# Patient Record
Sex: Male | Born: 1945 | Race: Black or African American | Hispanic: No | Marital: Married | Smoking: Never smoker
Health system: Southern US, Community
[De-identification: ages and names within clinical notes are randomized; demographics above are authoritative.]

## PROBLEM LIST (undated history)

## (undated) DIAGNOSIS — Z941 Heart transplant status: Secondary | ICD-10-CM

## (undated) DIAGNOSIS — Z8619 Personal history of other infectious and parasitic diseases: Secondary | ICD-10-CM

## (undated) DIAGNOSIS — R972 Elevated prostate specific antigen [PSA]: Secondary | ICD-10-CM

## (undated) DIAGNOSIS — Z9989 Dependence on other enabling machines and devices: Secondary | ICD-10-CM

## (undated) DIAGNOSIS — I10 Essential (primary) hypertension: Secondary | ICD-10-CM

## (undated) DIAGNOSIS — K219 Gastro-esophageal reflux disease without esophagitis: Secondary | ICD-10-CM

## (undated) DIAGNOSIS — F419 Anxiety disorder, unspecified: Secondary | ICD-10-CM

## (undated) DIAGNOSIS — I509 Heart failure, unspecified: Secondary | ICD-10-CM

## (undated) DIAGNOSIS — J189 Pneumonia, unspecified organism: Secondary | ICD-10-CM

## (undated) DIAGNOSIS — G709 Myoneural disorder, unspecified: Secondary | ICD-10-CM

## (undated) DIAGNOSIS — T7840XA Allergy, unspecified, initial encounter: Secondary | ICD-10-CM

## (undated) DIAGNOSIS — M810 Age-related osteoporosis without current pathological fracture: Secondary | ICD-10-CM

## (undated) DIAGNOSIS — G4733 Obstructive sleep apnea (adult) (pediatric): Secondary | ICD-10-CM

## (undated) DIAGNOSIS — I420 Dilated cardiomyopathy: Secondary | ICD-10-CM

## (undated) DIAGNOSIS — M109 Gout, unspecified: Secondary | ICD-10-CM

## (undated) HISTORY — DX: Dependence on other enabling machines and devices: Z99.89

## (undated) HISTORY — DX: Anxiety disorder, unspecified: F41.9

## (undated) HISTORY — DX: Obstructive sleep apnea (adult) (pediatric): G47.33

## (undated) HISTORY — DX: Heart transplant status: Z94.1

## (undated) HISTORY — DX: Dilated cardiomyopathy: I42.0

## (undated) HISTORY — DX: Essential (primary) hypertension: I10

## (undated) HISTORY — DX: Elevated prostate specific antigen (PSA): R97.20

## (undated) HISTORY — DX: Gout, unspecified: M10.9

## (undated) HISTORY — DX: Age-related osteoporosis without current pathological fracture: M81.0

## (undated) HISTORY — DX: Pneumonia, unspecified organism: J18.9

## (undated) HISTORY — DX: Heart failure, unspecified: I50.9

## (undated) HISTORY — DX: Allergy, unspecified, initial encounter: T78.40XA

## (undated) HISTORY — DX: Myoneural disorder, unspecified: G70.9

## (undated) HISTORY — DX: Personal history of other infectious and parasitic diseases: Z86.19

## (undated) HISTORY — DX: Gastro-esophageal reflux disease without esophagitis: K21.9

---

## 2002-02-16 ENCOUNTER — Observation Stay (HOSPITAL_COMMUNITY): Admission: EM | Admit: 2002-02-16 | Discharge: 2002-02-17 | Payer: Self-pay | Admitting: *Deleted

## 2002-09-27 ENCOUNTER — Inpatient Hospital Stay (HOSPITAL_COMMUNITY): Admission: EM | Admit: 2002-09-27 | Discharge: 2002-10-05 | Payer: Self-pay | Admitting: Emergency Medicine

## 2002-09-27 ENCOUNTER — Encounter: Payer: Self-pay | Admitting: Emergency Medicine

## 2002-10-10 ENCOUNTER — Emergency Department (HOSPITAL_COMMUNITY): Admission: EM | Admit: 2002-10-10 | Discharge: 2002-10-10 | Payer: Self-pay | Admitting: Emergency Medicine

## 2008-11-29 DIAGNOSIS — Z941 Heart transplant status: Secondary | ICD-10-CM

## 2008-11-29 HISTORY — PX: HEART TRANSPLANT: SHX268

## 2008-11-29 HISTORY — DX: Heart transplant status: Z94.1

## 2010-12-07 DIAGNOSIS — Z8619 Personal history of other infectious and parasitic diseases: Secondary | ICD-10-CM

## 2010-12-07 HISTORY — DX: Personal history of other infectious and parasitic diseases: Z86.19

## 2013-10-19 ENCOUNTER — Encounter: Payer: Self-pay | Admitting: Family Medicine

## 2013-10-19 ENCOUNTER — Ambulatory Visit (INDEPENDENT_AMBULATORY_CARE_PROVIDER_SITE_OTHER): Payer: Medicare Other | Admitting: Family Medicine

## 2013-10-19 VITALS — BP 126/86 | HR 100 | Temp 98.2°F | Ht 69.0 in | Wt 185.8 lb

## 2013-10-19 DIAGNOSIS — I509 Heart failure, unspecified: Secondary | ICD-10-CM

## 2013-10-19 DIAGNOSIS — G250 Essential tremor: Secondary | ICD-10-CM

## 2013-10-19 DIAGNOSIS — I10 Essential (primary) hypertension: Secondary | ICD-10-CM | POA: Insufficient documentation

## 2013-10-19 DIAGNOSIS — M109 Gout, unspecified: Secondary | ICD-10-CM

## 2013-10-19 DIAGNOSIS — Z941 Heart transplant status: Secondary | ICD-10-CM

## 2013-10-19 MED ORDER — GABAPENTIN 100 MG PO CAPS: 100.0000 mg | ORAL_CAPSULE | Freq: Two times a day (BID) | ORAL | Status: DC

## 2013-10-19 NOTE — Assessment & Plan Note (Signed)
Will check urate next blood draw.

## 2013-10-19 NOTE — Assessment & Plan Note (Signed)
Strong fmhx. Discussed treatment - will do trial of gabapentin - discussing possible side effects of dizziness, sedation.

## 2013-10-19 NOTE — Patient Instructions (Addendum)
Start gabapentin 100mg  nightly for 4 days then twice daily to see if we can improve tremor. I will await records from prior urologist and from heart doctors at [facility]. Good to meet you today, call us with questions. Return in 3-4 months fasting for blood work and afterwards for [employer] visit.

## 2013-10-19 NOTE — Assessment & Plan Note (Signed)
Will await records from [facility] On cellcept and prograf as well as fosamax preventatively. Discussed pneumovax.

## 2013-10-19 NOTE — Progress Notes (Signed)
Pre-visit discussion using our clinic review tool. No additional management support is needed unless otherwise documented below in the visit note.  

## 2013-10-19 NOTE — Assessment & Plan Note (Signed)
Chronic, stable.  Continue diltiazem 120mg  bid regimen.

## 2013-10-19 NOTE — Progress Notes (Signed)
Subjective:    Patient ID: Frederick Stephens, male    DOB: 1946/01/05, 67 y.o.   MRN: [id number]  HPI CC: new pt to establish  Received medicare 8-9 years ago.  S/p heart transplant for CHF with cardiomyopathy - cardiologist at [facility] (Dr. Cindra Eves).  Sees them 4x/year regularly.  On prograf and cellcept - taking fosamax once weekly preventatively.  Has stress test regularly.  L muscle pulled recently while playing golf - rec tylenol, stretching, and ice.  Ongoing for last 2 months.  GERD - on omeprazole 20mg  once daily. HTN - on diltiazem 120mg  bid. Gout - did have flare 1 month ago, improved with diet change.  Not on any preventative med for this.  Tremors - noted over last several years.  Only of hands.  Intention tremor.  Worse with writing.  Has been on some med for this several years ago.  Does have family history of hand tremors.  No other neurological sxs like imbalance, dizziness, unsteadiness.  No memory trouble endorsed.  No urinary changes.  Normal sense of smell.  Preventative: Unsure last CPE Colonoscopy - normal done 6 years ago ~2008 (done in [place]) Prostate - sees urologist at [facility] yearly Flu shot - done Pneumovax - not done zostavax - not candidate  Medications and allergies reviewed and updated in chart.  Past histories reviewed and updated if relevant as below. There are no active problems to display for this patient.  Past Medical History  Diagnosis Date  . HTN (hypertension)   . Gout   . CHF (congestive heart failure)     ?HTN related  . H/O heart transplant 11/29/08    due to heart failure,cardiomyopathy  . Dilated cardiomyopathy   . History of chicken pox   . History of shingles 2012   Past Surgical History  Procedure Laterality Date  . Heart transplant  11/29/08    [facility]   History  Substance Use Topics  . Smoking status: Never Smoker   . Smokeless tobacco: Never Used  . Alcohol Use: No   Family History  Problem  Relation Age of Onset  . Gout Mother   . Gout Brother   . Hypertension Brother   . Hypertension Mother   . Heart disease Mother   . Heart disease Father   . Diabetes Brother   . Cancer Neg Hx   . Stroke Neg Hx    Allergies  Allergen Reactions  . Amiodarone Other (See Comments)    Gynocomastia   No current outpatient prescriptions on file prior to visit.   No current facility-administered medications on file prior to visit.     Review of Systems  Constitutional: Negative for fever, chills, activity change, appetite change, fatigue and unexpected weight change.  HENT: Negative for hearing loss.   Eyes: Negative for visual disturbance.  Respiratory: Negative for cough, chest tightness, shortness of breath and wheezing.   Cardiovascular: Negative for chest pain, palpitations and leg swelling.  Gastrointestinal: Negative for nausea, vomiting, abdominal pain, diarrhea, constipation, blood in stool and abdominal distention.  Genitourinary: Negative for hematuria and difficulty urinating.  Musculoskeletal: Negative for arthralgias, myalgias and neck pain.  Skin: Negative for rash.  Neurological: Negative for dizziness, seizures, syncope and headaches.  Hematological: Negative for adenopathy. Does not bruise/bleed easily.  Psychiatric/Behavioral: Negative for dysphoric mood. The patient is not nervous/anxious.        Objective:   Physical Exam  Nursing note and vitals reviewed. Constitutional: He is oriented to person, place,  and time. He appears well-developed and well-nourished. No distress.  HENT:  Head: Normocephalic and atraumatic.  Mouth/Throat: Oropharynx is clear and moist. No oropharyngeal exudate.  Eyes: Conjunctivae and EOM are normal. Pupils are equal, round, and reactive to light. No scleral icterus.  Neck: Normal range of motion. Neck supple. No thyromegaly present.  Cardiovascular: Normal rate, regular rhythm, normal heart sounds and intact distal pulses.   No  murmur heard. Pulses:      Radial pulses are 2+ on the right side, and 2+ on the left side.  Pulmonary/Chest: Effort normal and breath sounds normal. No respiratory distress. He has no wheezes. He has no rales.  Musculoskeletal: Normal range of motion. He exhibits no edema.  Lymphadenopathy:    He has no cervical adenopathy.  Neurological: He is alert and oriented to person, place, and time.  CN grossly intact, station and gait intact Resting tremor present bilaterally  Skin: Skin is warm and dry. No rash noted.  Psychiatric: He has a normal mood and affect. His behavior is normal. Judgment and thought content normal.       Assessment & Plan:

## 2013-11-28 LAB — CBC
HGB: 14.3 g/dL
WBC: 7
platelet count: 194

## 2013-11-28 LAB — COMPLETE METABOLIC PANEL WITH GFR
ALBUMIN: 4.4
ALK PHOS: 62 U/L
ALT: 11 U/L (ref 10–40)
AST: 12 U/L
Creat: 1.23
GLUCOSE: 97
Potassium: 4.5 mmol/L
Sodium: 141 mmol/L (ref 137–147)
Total Bilirubin: 0.5 mg/dL

## 2013-11-28 LAB — MAGNESIUM: Magnesium: 2.3 mg/dL (ref 1.6–2.4)

## 2013-12-07 DIAGNOSIS — M810 Age-related osteoporosis without current pathological fracture: Secondary | ICD-10-CM

## 2013-12-07 HISTORY — DX: Age-related osteoporosis without current pathological fracture: M81.0

## 2013-12-28 ENCOUNTER — Ambulatory Visit: Payer: Self-pay | Admitting: Family Medicine

## 2014-01-03 ENCOUNTER — Ambulatory Visit (INDEPENDENT_AMBULATORY_CARE_PROVIDER_SITE_OTHER)
Admission: RE | Admit: 2014-01-03 | Discharge: 2014-01-03 | Disposition: A | Discharge status: Home / Self Care | Payer: Medicare Other | Source: Ambulatory Visit | Attending: Family Medicine | Admitting: Family Medicine

## 2014-01-03 ENCOUNTER — Ambulatory Visit (INDEPENDENT_AMBULATORY_CARE_PROVIDER_SITE_OTHER): Payer: Medicare Other | Admitting: Family Medicine

## 2014-01-03 ENCOUNTER — Encounter: Payer: Self-pay | Admitting: Family Medicine

## 2014-01-03 VITALS — BP 110/74 | HR 116 | Temp 98.1°F | Wt 184.8 lb

## 2014-01-03 DIAGNOSIS — R05 Cough: Secondary | ICD-10-CM

## 2014-01-03 DIAGNOSIS — R059 Cough, unspecified: Secondary | ICD-10-CM

## 2014-01-03 DIAGNOSIS — B9789 Other viral agents as the cause of diseases classified elsewhere: Secondary | ICD-10-CM

## 2014-01-03 DIAGNOSIS — J189 Pneumonia, unspecified organism: Secondary | ICD-10-CM

## 2014-01-03 DIAGNOSIS — J069 Acute upper respiratory infection, unspecified: Secondary | ICD-10-CM

## 2014-01-03 HISTORY — DX: Pneumonia, unspecified organism: J18.9

## 2014-01-03 LAB — POCT INFLUENZA A/B
INFLUENZA A, POC: NEGATIVE
Influenza B, POC: NEGATIVE

## 2014-01-03 MED ORDER — CEFUROXIME AXETIL 500 MG PO TABS: 500.0000 mg | ORAL_TABLET | Freq: Two times a day (BID) | ORAL | Status: DC

## 2014-01-03 MED ORDER — DOXYCYCLINE HYCLATE 100 MG PO CAPS: 100.0000 mg | ORAL_CAPSULE | Freq: Two times a day (BID) | ORAL | Status: DC

## 2014-01-03 NOTE — Assessment & Plan Note (Addendum)
In immunocompromised status (prograf and cellcept) Check flu swab - negative Check CXR given abnormal lung exam - patchy hazy opacity RLL suspicious for PNA. Held off on fluoroquinolone 2/2 possible pro-arrhythmic potential with tacrolimus. Treat with cefuroxime and doxycycline. Red flags to return discussed.

## 2014-01-03 NOTE — Patient Instructions (Addendum)
I am concerned about lung infection - treat with cefuroxime 500mg  twice daily for 7 days plus doxycycline 100mg  twice daily for 10 days - this has been sent into pharmacy. If persistently feeling bad despite medicine, or new fevers or worsening productive cough, let us know. Flu swab was negative today. Let [facility] know you're on antibiotics for pneumonia.

## 2014-01-03 NOTE — Progress Notes (Signed)
Pre-visit discussion using our clinic review tool. No additional management support is needed unless otherwise documented below in the visit note.  

## 2014-01-03 NOTE — Progress Notes (Signed)
   Subjective:    Patient ID: Frederick Stephens, male    DOB: 19-Dec-1945, 68 y.o.   MRN: [id number]  HPI CC: URI  Started feeling ill Sunday evening (3d ago).  Started with ST that progressed to nasal and post nasal drainage.  Monday increased fatigue, with dry cough.  + body aches and headaches.  Tachycardia.  abd discomfort.  + congestion.  + mildly dyspneic  No fevers/chills, nausea, ear or tooth pain.  Heart transplant patient on immunosuppressants.  Did see sick friend over the weekend. Never smoker. No h/o asthma, COPD. Did receive flu shot this year.  Has appt with neuro 01/26/2014 to eval tremor Had checkup at [facility] this month.  Past Medical History  Diagnosis Date  . HTN (hypertension)   . Gout   . CHF (congestive heart failure)     ?HTN related  . H/O heart transplant 11/29/08    due to heart failure,cardiomyopathy  . Dilated cardiomyopathy   . History of chicken pox   . History of shingles 2012     Review of Systems Per HPI    Objective:   Physical Exam  Nursing note and vitals reviewed. Constitutional: He appears well-developed and well-nourished. No distress.  HENT:  Head: Normocephalic and atraumatic.  Right Ear: Hearing, tympanic membrane, external ear and ear canal normal.  Left Ear: Hearing, tympanic membrane, external ear and ear canal normal.  Nose: Mucosal edema present. No rhinorrhea. Right sinus exhibits no maxillary sinus tenderness and no frontal sinus tenderness. Left sinus exhibits no maxillary sinus tenderness and no frontal sinus tenderness.  Mouth/Throat: Uvula is midline, oropharynx is clear and moist and mucous membranes are normal. No oropharyngeal exudate, posterior oropharyngeal edema, posterior oropharyngeal erythema or tonsillar abscesses.  Eyes: Conjunctivae and EOM are normal. Pupils are equal, round, and reactive to light. No scleral icterus.  Neck: Normal range of motion. Neck supple.  Cardiovascular: Regular rhythm, normal heart  sounds and intact distal pulses.  Tachycardia present.   No murmur heard. Pulmonary/Chest: Effort normal and breath sounds normal. No respiratory distress. He has no decreased breath sounds. He has no wheezes. He has no rhonchi. He has no rales.  Crackles RLL that persist after cough  Lymphadenopathy:    He has no cervical adenopathy.  Skin: Skin is warm and dry. No rash noted.       Assessment & Plan:

## 2014-01-10 ENCOUNTER — Encounter: Payer: Self-pay | Admitting: *Deleted

## 2014-10-07 HISTORY — PX: COLONOSCOPY: SHX174

## 2014-10-09 LAB — HM COLONOSCOPY

## 2014-10-23 ENCOUNTER — Telehealth: Payer: Self-pay

## 2014-10-23 NOTE — Telephone Encounter (Signed)
[place] Records Department [phone]  RE: Colonoscopy results   Was instructed to send a fax with company letter head requesting those results to [phone]

## 2014-10-23 NOTE — Telephone Encounter (Signed)
Called pt and scheduled AWV for 2015 on 10/25/14 at 10 am.   Pt stated that he receives a lot of care from [place] stated that [facility] did a colonoscopy this year.   Needs BMI recorded. This is one of the items missing from his [employer] paperwork.

## 2014-10-25 ENCOUNTER — Encounter: Payer: Medicare Other | Admitting: Family Medicine

## 2014-10-25 ENCOUNTER — Encounter: Payer: Self-pay | Admitting: Family Medicine

## 2014-10-25 NOTE — Telephone Encounter (Signed)
Received notes from colonoscopy done at [facility].

## 2014-11-06 HISTORY — PX: OTHER SURGICAL HISTORY: SHX169

## 2014-12-08 ENCOUNTER — Encounter: Payer: Self-pay | Admitting: Family Medicine

## 2014-12-08 NOTE — Telephone Encounter (Signed)
fyi BMI 26.04 per latest cardiology note [facility] 11/27/2014.

## 2015-01-15 ENCOUNTER — Ambulatory Visit (INDEPENDENT_AMBULATORY_CARE_PROVIDER_SITE_OTHER): Payer: Medicare Other | Admitting: Family Medicine

## 2015-01-15 ENCOUNTER — Encounter: Payer: Self-pay | Admitting: Family Medicine

## 2015-01-15 VITALS — BP 118/84 | HR 80 | Temp 97.4°F | Ht 69.25 in | Wt 182.8 lb

## 2015-01-15 DIAGNOSIS — H6122 Impacted cerumen, left ear: Secondary | ICD-10-CM

## 2015-01-15 DIAGNOSIS — Z7189 Other specified counseling: Secondary | ICD-10-CM | POA: Insufficient documentation

## 2015-01-15 DIAGNOSIS — H6120 Impacted cerumen, unspecified ear: Secondary | ICD-10-CM | POA: Insufficient documentation

## 2015-01-15 DIAGNOSIS — Z0000 Encounter for general adult medical examination without abnormal findings: Secondary | ICD-10-CM | POA: Insufficient documentation

## 2015-01-15 DIAGNOSIS — Z23 Encounter for immunization: Secondary | ICD-10-CM

## 2015-01-15 DIAGNOSIS — I10 Essential (primary) hypertension: Secondary | ICD-10-CM

## 2015-01-15 NOTE — Assessment & Plan Note (Signed)
S/p irrigation, pt tolerated well.

## 2015-01-15 NOTE — Patient Instructions (Addendum)
Prevnar today (pneumonia shot).  Schedule eye doctor appointment.  Bring me copy of living will with health care power of attorney form. L ear irrigation performed today. Good to see you today, call us with questions.

## 2015-01-15 NOTE — Assessment & Plan Note (Signed)

## 2015-01-15 NOTE — Progress Notes (Signed)
Pre visit review using our clinic review tool, if applicable. No additional management support is needed unless otherwise documented below in the visit note. 

## 2015-01-15 NOTE — Assessment & Plan Note (Addendum)
Advanced planning: has living will at home. HCPOA is sister Kavin Leech.  Will bring me copy

## 2015-01-15 NOTE — Assessment & Plan Note (Signed)
Chronic, stable. Pt tolerating well. Continue diltiazem. Followed regularly by cards. Recent note reviewed.

## 2015-01-15 NOTE — Addendum Note (Signed)
Addended by: Royann Shivers A on: 01/15/2015 04:42 PM   Modules accepted: Orders

## 2015-01-15 NOTE — Progress Notes (Signed)
BP 118/84 mmHg  Pulse 80  Temp(Src) 97.4 F (36.3 C) (Oral)  Ht 5' 9.25" (1.759 m)  Wt 182 lb 12 oz (82.895 kg)  BMI 26.79 kg/m2   CC: medicare wellness visit  Subjective:    Patient ID: Frederick Stephens, male    DOB: Jun 09, 1946, 69 y.o.   MRN: [id number]  HPI: Frederick Stephens is a 69 y.o. male presenting on 01/15/2015 for Annual Exam  Followed by [facility]. Recent labs and office visit reviewed.  Having some painful ejaculation ongoing over last 3 months. No trouble with erection. No burning, no dysuria, no discharge. Has f/u with urology schedule for next year.  S/p heart transplant for CHF with cardiomyopathy - cardiologist at [facility] (Dr. Rosalin Hawking and Leonides Schanz). Sees them 4x/year regularly.Had labwork done 3 wks ago. On prograf and cellcept - taking fosamax once weekly for osteoporosis. Has stress test regularly.  Osteoporosis - recent DEXA   Failed hearing screen - L ear.  Failed vision screen - R eye failed. Due to schedule eye exam. No falls in last year Denies depression/anhedonia.  Preventative: Colonoscopy - normal 10/2014 [facility]) Prostate - sees urologist yearly. PSA 3.97 11/2014. DEXA - T -2.5 hip (osteoporosis)  Flu shot - done prevnar today. zostavax - not candidate Advanced planning: has living will at home. HCPOA is sister Kavin Leech. Will bring me copy  Lives alone Son lives nearby - as well as extended family in [place]. 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 Interim medical history since our last visit reviewed. Allergies and medications reviewed and updated. Current Outpatient Prescriptions on File Prior to Visit  Medication Sig  . alendronate (FOSAMAX) 70 MG tablet Take 70 mg by mouth once a week. Take with a full glass of water on an empty stomach.  Marland Kitchen amoxicillin  (AMOXIL) 500 MG capsule Take 2,000 mg by mouth as directed. 1 hour prior to dental procedure  . aspirin 81 MG tablet Take 81 mg by mouth daily.  . hydrocortisone (ANUSOL-HC) 2.5 % rectal cream Place 1 application rectally 2 (two) times daily as needed for hemorrhoids or itching.  . Mycophenolate Mofetil (CELLCEPT PO) Take 750 mg by mouth 2 (two) times daily.  Marland Kitchen omeprazole (PRILOSEC) 20 MG capsule Take 20 mg by mouth daily.  . tacrolimus (PROGRAF) 0.5 MG capsule Take 0.5 mg by mouth 2 (two) times daily.   No current facility-administered medications on file prior to visit.    Review of Systems  Constitutional: Negative for fever, chills, activity change, appetite change, fatigue and unexpected weight change.  HENT: Negative for hearing loss.   Eyes: Positive for visual disturbance (due for vision screen).  Respiratory: Negative for cough, chest tightness, shortness of breath and wheezing.   Cardiovascular: Negative for chest pain, palpitations and leg swelling.  Gastrointestinal: Negative for nausea, vomiting, abdominal pain, diarrhea, constipation, blood in stool and abdominal distention.  Genitourinary: Negative for hematuria and difficulty urinating.  Musculoskeletal: Negative for myalgias, arthralgias and neck pain.  Skin: Negative for rash.  Neurological: Negative for dizziness, seizures, syncope and headaches.  Hematological: Negative for adenopathy. Does not bruise/bleed easily.  Psychiatric/Behavioral: Negative for dysphoric mood. The patient is not nervous/anxious.    Per HPI unless specifically indicated above     Objective:    BP 118/84 mmHg  Pulse 80  Temp(Src) 97.4 F (36.3  C) (Oral)  Ht 5' 9.25" (1.759 m)  Wt 182 lb 12 oz (82.895 kg)  BMI 26.79 kg/m2  Wt Readings from Last 3 Encounters:  01/15/15 182 lb 12 oz (82.895 kg)  01/03/14 184 lb 12 oz (83.802 kg)  10/19/13 185 lb 12 oz (84.256 kg)    Physical Exam  Constitutional: He is oriented to person, place, and  time. He appears well-developed and well-nourished. No distress.  HENT:  Head: Normocephalic and atraumatic.  Right Ear: Hearing, tympanic membrane, external ear and ear canal normal.  Left Ear: Hearing, external ear and ear canal normal.  Nose: Nose normal.  Mouth/Throat: Uvula is midline, oropharynx is clear and moist and mucous membranes are normal. No oropharyngeal exudate, posterior oropharyngeal edema or posterior oropharyngeal erythema.  L cerumen impaction unsuccessful removal with plastic curette, s/p successful irrigation   Eyes: Conjunctivae and EOM are normal. Pupils are equal, round, and reactive to light. No scleral icterus.  Neck: Normal range of motion. Neck supple. Carotid bruit is not present. No thyromegaly present.  Cardiovascular: Normal rate, regular rhythm, normal heart sounds and intact distal pulses.   No murmur heard. Pulses:      Radial pulses are 2+ on the right side, and 2+ on the left side.  Pulmonary/Chest: Effort normal and breath sounds normal. No respiratory distress. He has no wheezes. He has no rales.  Abdominal: Soft. Bowel sounds are normal. He exhibits no distension and no mass. There is no tenderness. There is no rebound and no guarding.  Genitourinary:  GYN - deferred, as per URO  Musculoskeletal: Normal range of motion. He exhibits no edema.  Lymphadenopathy:    He has no cervical adenopathy.  Neurological: He is alert and oriented to person, place, and time.  CN grossly intact, station and gait intact Recall 3/3 Calculation 5/5 serial 7s  Skin: Skin is warm and dry. No rash noted.  Psychiatric: He has a normal mood and affect. His behavior is normal. Judgment and thought content normal.  Nursing note and vitals reviewed.  Results for orders placed or performed in visit on 10/25/14  [facility] COLONOSCOPY  Result Value Ref Range   [facility] Colonoscopy 10/09/2014       Assessment & Plan:   Problem List Items Addressed This Visit    Medicare annual  wellness visit, subsequent - Primary    I have personally reviewed the Medicare Annual Wellness questionnaire and have noted 1. The patient's medical and social history 2. Their use of alcohol, tobacco or illicit drugs 3. Their current medications and supplements 4. The patient's functional ability including ADL's, fall risks, home safety risks and hearing or visual impairment. 5. Diet and physical activity 6. Evidence for depression or mood disorders The patients weight, height, BMI have been recorded in the chart.  Hearing and vision has been addressed. I have made referrals, counseling and provided education to the patient based review of the above and I have provided the pt with a written personalized care plan for preventive services. Provider list updated - see scanned questionairre. Reviewed preventative protocols and updated unless pt declined.       HTN (hypertension)    Chronic, stable. Pt tolerating well. Continue diltiazem. Followed regularly by cards. Recent note reviewed.      Relevant Medications   amLODipine (NORVASC) 5 MG tablet   pravastatin (PRAVACHOL) 20 MG tablet   propranolol (INDERAL) 40 MG tablet   Hearing loss due to cerumen impaction    S/p irrigation, pt tolerated  well.      Health maintenance examination   Advanced care planning/counseling discussion    Advanced planning: has living will at home. HCPOA is sister Kavin Leech.  Will bring me copy          Follow up plan: Return in about 1 year (around 01/16/2016), or as needed, for medicare wellness visit.

## 2015-01-22 ENCOUNTER — Telehealth: Payer: Self-pay | Admitting: Family Medicine

## 2015-01-22 NOTE — Telephone Encounter (Signed)
emmi emailed °

## 2015-06-11 ENCOUNTER — Encounter: Payer: Self-pay | Admitting: Family Medicine

## 2015-06-11 DIAGNOSIS — G4733 Obstructive sleep apnea (adult) (pediatric): Secondary | ICD-10-CM | POA: Insufficient documentation

## 2015-06-11 DIAGNOSIS — Z9989 Dependence on other enabling machines and devices: Secondary | ICD-10-CM

## 2015-06-11 DIAGNOSIS — M810 Age-related osteoporosis without current pathological fracture: Secondary | ICD-10-CM | POA: Insufficient documentation

## 2015-08-02 ENCOUNTER — Encounter: Payer: Self-pay | Admitting: Family Medicine

## 2015-08-02 ENCOUNTER — Ambulatory Visit (INDEPENDENT_AMBULATORY_CARE_PROVIDER_SITE_OTHER): Payer: Medicare Other | Admitting: Family Medicine

## 2015-08-02 VITALS — BP 120/80 | HR 83 | Temp 97.5°F | Ht 69.25 in | Wt 180.8 lb

## 2015-08-02 DIAGNOSIS — J349 Unspecified disorder of nose and nasal sinuses: Secondary | ICD-10-CM | POA: Diagnosis not present

## 2015-08-02 MED ORDER — AMOXICILLIN 500 MG PO CAPS: 1000.0000 mg | ORAL_CAPSULE | Freq: Two times a day (BID) | ORAL | Status: DC

## 2015-08-02 NOTE — Assessment & Plan Note (Signed)
Most liekly allergies or viral infection ( no sinus pain or fever).. But pt high risk immunocompromised and 2 weeks of symptoms. Will have him try nasal irrigation and nasal steroid spray. If not improving treat with antibiotics.

## 2015-08-02 NOTE — Progress Notes (Signed)
Pre visit review using our clinic review tool, if applicable. No additional management support is needed unless otherwise documented below in the visit note. 

## 2015-08-02 NOTE — Progress Notes (Signed)
   Subjective:    Patient ID: Frederick Stephens, male    DOB: 1946-02-22, 69 y.o.   MRN: [id number]  Sinusitis This is a new problem. The current episode started 1 to 4 weeks ago. The problem has been gradually worsening since onset. There has been no fever. The pain is moderate. Associated symptoms include congestion, sinus pressure, sneezing and a sore throat. Pertinent negatives include no chills, coughing, ear pain, neck pain or shortness of breath. Treatments tried: benadryl. The treatment provided mild relief.    Hx of heart transplant on immunocompromised on tacrolimus/ cellcept, CHF. No hx of sinus infection or sinus surgery.  Has allergic rhinitis in spring.  Review of Systems  Constitutional: Negative for chills.  HENT: Positive for congestion, sinus pressure, sneezing and sore throat. Negative for ear pain.   Respiratory: Negative for cough and shortness of breath.   Musculoskeletal: Negative for neck pain.       Objective:   Physical Exam  Constitutional: Vital signs are normal. He appears well-developed and well-nourished.  HENT:  Head: Normocephalic.  Right Ear: Hearing normal. No middle ear effusion.  Left Ear: Hearing normal.  No middle ear effusion.  Nose: Mucosal edema and rhinorrhea present.  Mouth/Throat: Oropharynx is clear and moist and mucous membranes are normal. No oropharyngeal exudate, posterior oropharyngeal edema, posterior oropharyngeal erythema or tonsillar abscesses.  Neck: Trachea normal. Carotid bruit is not present. No thyroid mass and no thyromegaly present.  Cardiovascular: Normal rate, regular rhythm and normal pulses.  Exam reveals no gallop, no distant heart sounds and no friction rub.   No murmur heard. No peripheral edema  Pulmonary/Chest: Effort normal and breath sounds normal. No respiratory distress.  Skin: Skin is warm, dry and intact. No rash noted.  Psychiatric: He has a normal mood and affect. His speech is normal and behavior is  normal. Thought content normal.          Assessment & Plan:

## 2015-08-02 NOTE — Patient Instructions (Signed)
Start nasal saline irrigation daily .  Can consider flonase OTC nasal steroid spray 2 sprays per nostril daily.  If not improving in next few days, fill antibiotics.

## 2015-08-30 ENCOUNTER — Encounter: Payer: Self-pay | Admitting: Family Medicine

## 2015-08-30 ENCOUNTER — Ambulatory Visit (INDEPENDENT_AMBULATORY_CARE_PROVIDER_SITE_OTHER): Payer: Medicare Other | Admitting: Family Medicine

## 2015-08-30 VITALS — BP 119/80 | HR 77 | Temp 98.0°F | Resp 16 | Wt 181.0 lb

## 2015-08-30 DIAGNOSIS — L290 Pruritus ani: Secondary | ICD-10-CM

## 2015-08-30 DIAGNOSIS — Z23 Encounter for immunization: Secondary | ICD-10-CM

## 2015-08-30 MED ORDER — HYDROCORTISONE 2.5 % RE CREA: 1.0000 "application " | TOPICAL_CREAM | Freq: Two times a day (BID) | RECTAL | Status: DC | PRN

## 2015-08-30 MED ORDER — CLOTRIMAZOLE 1 % EX CREA: 1.0000 "application " | TOPICAL_CREAM | Freq: Two times a day (BID) | CUTANEOUS | Status: DC

## 2015-08-30 MED ORDER — HYDROCORTISONE ACETATE 25 MG RE SUPP: 25.0000 mg | Freq: Two times a day (BID) | RECTAL | Status: DC

## 2015-08-30 NOTE — Progress Notes (Signed)
BP 119/80 mmHg  Pulse 77  Temp(Src) 98 F (36.7 C)  Resp 16  Wt 181 lb (82.101 kg)  SpO2 98%   CC: hemorrhoids  Subjective:    Patient ID: Frederick Stephens, male    DOB: [birth year], 69 y.o.   MRN: [id number]  HPI: Frederick Stephens is a 69 y.o. male presenting on 08/30/2015 for Discuss Hemorriods   3-4 yr h/o painful rectal and perianal burning itch worse after BM. Has previously treated with proctosol HC cream. This issue started flaring up over summer. Normally has 1-2 BM/day. No night time awakenings. Looser bowels than normal.   Uses city water. No recent travel.  No h/o radiation.   S/p heart transplant for CHF with cardiomyopathy - cardiologist at [facility] (Dr. Rosalin Hawking and Leonides Schanz). On prograf and cellcept - taking fosamax once weekly for osteoporosis. Has stress test regularly.  Colonoscopy - normal 10/2014 [facility]) Normal DRE 02/2015.   Relevant past medical, surgical, family and social history reviewed and updated as indicated. Interim medical history since our last visit reviewed. Allergies and medications reviewed and updated. Current Outpatient Prescriptions on File Prior to Visit  Medication Sig  . alendronate (FOSAMAX) 70 MG tablet Take 70 mg by mouth once a week. Take with a full glass of water on an empty stomach.  Marland Kitchen amLODipine (NORVASC) 5 MG tablet Take 5 mg by mouth daily.  Marland Kitchen aspirin 81 MG tablet Take 81 mg by mouth daily.  . Calcium Carb-Cholecalciferol (CALCIUM-VITAMIN D) 600-400 MG-UNIT TABS Take 1 tablet by mouth daily.  . Magnesium 400 MG TABS Take 1 tablet by mouth 3 (three) times daily.  . Mycophenolate Mofetil (CELLCEPT PO) Take 750 mg by mouth 2 (two) times daily.  Marland Kitchen omeprazole (PRILOSEC) 20 MG capsule Take 20 mg by mouth daily.  . pravastatin (PRAVACHOL) 20 MG tablet Take 20 mg by mouth daily.  . propranolol (INDERAL) 40 MG tablet Take by mouth as directed. 20 mg every morning and 40 mg every evening  . tacrolimus (PROGRAF) 0.5 MG capsule  Take 0.5 mg by mouth 2 (two) times daily.  Marland Kitchen amoxicillin (AMOXIL) 500 MG capsule Take 2,000 mg by mouth as directed. 1 hour prior to dental procedure   No current facility-administered medications on file prior to visit.    Review of Systems Per HPI unless specifically indicated above     Objective:    BP 119/80 mmHg  Pulse 77  Temp(Src) 98 F (36.7 C)  Resp 16  Wt 181 lb (82.101 kg)  SpO2 98%  Wt Readings from Last 3 Encounters:  08/30/15 181 lb (82.101 kg)  08/02/15 180 lb 12 oz (81.988 kg)  01/15/15 182 lb 12 oz (82.895 kg)    Physical Exam  Constitutional: He appears well-developed and well-nourished. No distress.  Genitourinary: Rectal exam shows no external hemorrhoid, no fissure and no tenderness.  Perianal erythema without skin breakdown, no obvious anal fissures or skin lesions  Nursing note and vitals reviewed.  Results for orders placed or performed in visit on 10/25/14  HM COLONOSCOPY  Result Value Ref Range   HM Colonoscopy 10/09/2014       Assessment & Plan:   Problem List Items Addressed This Visit    Anal itching - Primary    Acute flare of chronic problem.  Exam with raw perianal erythema. Using expired anusol cream. Given endorsed rectal itching as well, will prescribe anusol HC suppositories. Stop perianal steroid cream, trial clotrimazole for possible candidal etiology. If no better,  will refer to GI. Pt agrees with plan. No red flags today.  He is on chronic cellcept and prograf s/p heart transplant followed by [facility] CT surgery, both meds can cause skin rash/pruritis.       Other Visit Diagnoses    Need for prophylactic vaccination and inoculation against influenza        Relevant Orders    Flu Vaccine QUAD 36+ mos PF IM (Fluarix & Fluzone Quad PF) (Completed)        Follow up plan: Return if symptoms worsen or fail to improve, for follow up visit.

## 2015-08-30 NOTE — Assessment & Plan Note (Addendum)
Acute flare of chronic problem.  Exam with raw perianal erythema. Using expired anusol cream. Given endorsed rectal itching as well, will prescribe anusol HC suppositories. Stop perianal steroid cream, trial clotrimazole for possible candidal etiology. If no better, will refer to GI. Pt agrees with plan. No red flags today.  He is on chronic cellcept and prograf s/p heart transplant followed by [facility] CT surgery, both meds can cause skin rash/pruritis.

## 2015-08-30 NOTE — Patient Instructions (Addendum)
Treat with anusol hydrocortisone suppositories twice daily for next several days (3-5 days). Use over the counter lotrimin cream around bottom area for possible fungal infection. If no better with this, let us know for referral to GI.  Flu shot today

## 2015-09-09 ENCOUNTER — Other Ambulatory Visit: Payer: Self-pay | Admitting: Family Medicine

## 2015-11-07 ENCOUNTER — Telehealth: Payer: Self-pay | Admitting: Family Medicine

## 2015-11-07 ENCOUNTER — Emergency Department (HOSPITAL_COMMUNITY)
Admission: EM | Admit: 2015-11-07 | Discharge: 2015-11-07 | Disposition: A | Discharge status: Home / Self Care | Payer: Medicare Other | Attending: Emergency Medicine | Admitting: Emergency Medicine

## 2015-11-07 ENCOUNTER — Encounter (HOSPITAL_COMMUNITY): Payer: Self-pay | Admitting: Emergency Medicine

## 2015-11-07 DIAGNOSIS — M810 Age-related osteoporosis without current pathological fracture: Secondary | ICD-10-CM | POA: Diagnosis not present

## 2015-11-07 DIAGNOSIS — Z9981 Dependence on supplemental oxygen: Secondary | ICD-10-CM | POA: Insufficient documentation

## 2015-11-07 DIAGNOSIS — G4733 Obstructive sleep apnea (adult) (pediatric): Secondary | ICD-10-CM | POA: Insufficient documentation

## 2015-11-07 DIAGNOSIS — I10 Essential (primary) hypertension: Secondary | ICD-10-CM | POA: Insufficient documentation

## 2015-11-07 DIAGNOSIS — R040 Epistaxis: Secondary | ICD-10-CM | POA: Insufficient documentation

## 2015-11-07 DIAGNOSIS — Z7982 Long term (current) use of aspirin: Secondary | ICD-10-CM | POA: Diagnosis not present

## 2015-11-07 DIAGNOSIS — Z941 Heart transplant status: Secondary | ICD-10-CM | POA: Diagnosis not present

## 2015-11-07 DIAGNOSIS — I509 Heart failure, unspecified: Secondary | ICD-10-CM | POA: Diagnosis not present

## 2015-11-07 DIAGNOSIS — Z79899 Other long term (current) drug therapy: Secondary | ICD-10-CM | POA: Diagnosis not present

## 2015-11-07 DIAGNOSIS — Z8619 Personal history of other infectious and parasitic diseases: Secondary | ICD-10-CM | POA: Insufficient documentation

## 2015-11-07 DIAGNOSIS — M109 Gout, unspecified: Secondary | ICD-10-CM | POA: Insufficient documentation

## 2015-11-07 DIAGNOSIS — Z7952 Long term (current) use of systemic steroids: Secondary | ICD-10-CM | POA: Diagnosis not present

## 2015-11-07 LAB — CBC WITH DIFFERENTIAL/PLATELET
Basophils Absolute: 0 10*3/uL (ref 0.0–0.1)
Basophils Relative: 0 %
Eosinophils Absolute: 0.2 10*3/uL (ref 0.0–0.7)
Eosinophils Relative: 3 %
HEMATOCRIT: 47.1 % (ref 39.0–52.0)
HEMOGLOBIN: 15.2 g/dL (ref 13.0–17.0)
LYMPHS ABS: 2.9 10*3/uL (ref 0.7–4.0)
LYMPHS PCT: 46 %
MCH: 29.7 pg (ref 26.0–34.0)
MCHC: 32.3 g/dL (ref 30.0–36.0)
MCV: 92.2 fL (ref 78.0–100.0)
MONOS PCT: 8 %
Monocytes Absolute: 0.5 10*3/uL (ref 0.1–1.0)
NEUTROS ABS: 2.7 10*3/uL (ref 1.7–7.7)
NEUTROS PCT: 43 %
Platelets: 205 10*3/uL (ref 150–400)
RBC: 5.11 MIL/uL (ref 4.22–5.81)
RDW: 13.3 % (ref 11.5–15.5)
WBC: 6.3 10*3/uL (ref 4.0–10.5)

## 2015-11-07 LAB — BASIC METABOLIC PANEL
Anion gap: 6 (ref 5–15)
BUN: 17 mg/dL (ref 6–20)
CHLORIDE: 108 mmol/L (ref 101–111)
CO2: 28 mmol/L (ref 22–32)
CREATININE: 1.15 mg/dL (ref 0.61–1.24)
Calcium: 9.5 mg/dL (ref 8.9–10.3)
GFR calc non Af Amer: >60 mL/min (ref >60)
Glucose, Bld: 100 mg/dL — ABNORMAL HIGH (ref 65–99)
POTASSIUM: 4.4 mmol/L (ref 3.5–5.1)
Sodium: 142 mmol/L (ref 135–145)

## 2015-11-07 LAB — I-STAT CHEM 8, ED
BUN: 21 mg/dL — AB (ref 6–20)
Calcium, Ion: 1.19 mmol/L (ref 1.13–1.30)
Chloride: 106 mmol/L (ref 101–111)
Creatinine, Ser: 1.1 mg/dL (ref 0.61–1.24)
Glucose, Bld: 98 mg/dL (ref 65–99)
HEMATOCRIT: 46 % (ref 39.0–52.0)
HEMOGLOBIN: 15.6 g/dL (ref 13.0–17.0)
Potassium: 4.7 mmol/L (ref 3.5–5.1)
SODIUM: 143 mmol/L (ref 135–145)
TCO2: 24 mmol/L (ref 0–100)

## 2015-11-07 MED ORDER — PHENYLEPHRINE HCL 0.5 % NA SOLN
1.0000 [drp] | Freq: Once | NASAL | Status: AC
  Administered 2015-11-07: 1 [drp] via NASAL
  Filled 2015-11-07: qty 15

## 2015-11-07 NOTE — Telephone Encounter (Signed)
Great. Thank you.

## 2015-11-07 NOTE — ED Notes (Signed)
MD at bedside. 

## 2015-11-07 NOTE — ED Notes (Signed)
Pt. reports epistaxis onset yesterday morning , denies injury , no bleeding at arrival .

## 2015-11-07 NOTE — Telephone Encounter (Signed)
Dr.Gutierrez had a cancellation on 11/15/15.  I called patient and scheduled appointment on 11/15/15 at 9:00.

## 2015-11-07 NOTE — Telephone Encounter (Signed)
Patient was seen at [facility] ER this morning.  Patient had Epistaxis. Patient was told to follow up with Dr.Gutierrez.  Patient is seeing his doctors at [facility] on 11/19/15 about his heart transplant .  He said it's not an emergency. When can I schedule the appointment for patient. Dr.Gutierrez' schedule is full.

## 2015-11-07 NOTE — ED Provider Notes (Signed)
CSN: [id number]     Arrival date & time 11/07/15  [id number] History  By signing my name below, I, Sonum Patel, attest that this documentation has been prepared under the direction and in the presence of Merryl Hacker, MD. Electronically Signed: Sonum Patel, Education administrator. 11/07/2015. 2:14 AM.    Chief Complaint  Patient presents with  . Epistaxis   The history is provided by the patient. No language interpreter was used.     HPI Comments: Frederick Stephens is a 69 y.o. male who presents to the Emergency Department complaining of 4 episodes of epistaxis over the last ~19 hours. Patient states the last episode began 2 hours ago and lasted about 30-40 min. He states the bleeding was initially from the right nare but then later began from the left nare. He denies taking any anti-coagulants aside from daily ASA 81mg . He has attempted to apply packing and pressure with temporary relief. He denies any other symptoms at this time.   Past Medical History  Diagnosis Date  . HTN (hypertension)   . Gout   . CHF (congestive heart failure) ([place])     ?HTN related  . S/P orthotopic heart transplant ([place]) 11/29/08    due to heart failure,cardiomyopathy  . 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 History of chicken pox   . History of shingles 2012  . OSA on CPAP   . Osteoporosis     per [facility] records on fosamax   Past Surgical History  Procedure Laterality Date  . Heart transplant  11/29/08    [facility] (Dr Tereasa Coop ONeill IV)  . Colonoscopy  10/2014    [facility])  . Dobutamine stress test  11/2014    normal  . Heart transplant     Family History  Problem Relation Age of Onset  . Gout Mother   . Gout Brother   . Hypertension Brother   . Hypertension Mother   . Heart disease Mother   . Heart disease Father   . Diabetes Brother   . Cancer Neg Hx   . Stroke Neg Hx    Social History  Substance Use Topics  . Smoking status: Never Smoker   . Smokeless tobacco: Never Used  . Alcohol  Use: No    Review of Systems  Constitutional: Negative for fever.  HENT: Positive for nosebleeds.   Respiratory: Negative for shortness of breath.   Cardiovascular: Negative for chest pain.      Allergies  Aldactone and Amiodarone  Home Medications   Prior to Admission medications   Medication Sig Start Date End Date Taking? Authorizing Provider  alendronate (FOSAMAX) 70 MG tablet Take 70 mg by mouth once a week. Take with a full glass of water on an empty stomach.    Historical Provider, MD  amLODipine (NORVASC) 5 MG tablet Take 5 mg by mouth daily.    Historical Provider, MD  amoxicillin (AMOXIL) 500 MG capsule Take 2,000 mg by mouth as directed. 1 hour prior to dental procedure    Historical Provider, MD  aspirin 81 MG tablet Take 81 mg by mouth daily.    Historical Provider, MD  Calcium Carb-Cholecalciferol (CALCIUM-VITAMIN D) 600-400 MG-UNIT TABS Take 1 tablet by mouth daily.    Historical Provider, MD  clotrimazole (LOTRIMIN) 1 % cream APPLY 1 APPLICATION TOPICALLY 2 TIMES DAILY. 09/09/15   Ria Bush, MD  hydrocortisone (ANUSOL-HC) 2.5 % rectal cream Place 1 application rectally 2 (two) times daily as needed for  hemorrhoids or itching. 08/30/15   Ria Bush, MD  hydrocortisone (ANUSOL-HC) 25 MG suppository Place 1 suppository (25 mg total) rectally 2 (two) times daily. 08/30/15   Ria Bush, MD  Magnesium 400 MG TABS Take 1 tablet by mouth 3 (three) times daily.    Historical Provider, MD  Mycophenolate Mofetil (CELLCEPT PO) Take 750 mg by mouth 2 (two) times daily.    Historical Provider, MD  omeprazole (PRILOSEC) 20 MG capsule Take 20 mg by mouth daily.    Historical Provider, MD  pravastatin (PRAVACHOL) 20 MG tablet Take 20 mg by mouth daily.    Historical Provider, MD  propranolol (INDERAL) 40 MG tablet Take by mouth as directed. 20 mg every morning and 40 mg every evening    Historical Provider, MD  tacrolimus (PROGRAF) 0.5 MG capsule Take 0.5 mg by mouth 2  (two) times daily.    Historical Provider, MD   BP 122/99 mmHg  Pulse 69  Temp(Src) 98 F (36.7 C) (Oral)  Resp 14  SpO2 99% Physical Exam  Constitutional: He is oriented to person, place, and time. He appears well-developed and well-nourished.  HENT:  Head: Normocephalic and atraumatic.  No active bleeding noted from the nare, no clots noted, friable tissue noted over the right nasal septum  Cardiovascular: Normal rate, regular rhythm and normal heart sounds.   Pulmonary/Chest: Effort normal and breath sounds normal. No respiratory distress. He has no wheezes.  Neurological: He is alert and oriented to person, place, and time.  Skin: Skin is warm and dry.  Psychiatric: He has a normal mood and affect.  Nursing note and vitals reviewed.  . ED Course  .Epistaxis Management Date/Time: 11/07/2015 2:59 AM Performed by: Merryl Hacker Authorized by: Merryl Hacker Consent: Verbal consent obtained. Risks and benefits: risks, benefits and alternatives were discussed Consent given by: patient Patient sedated: no Treatment site: right anterior Repair method: silver nitrate Post-procedure assessment: bleeding stopped Treatment complexity: simple Patient tolerance: Patient tolerated the procedure well with no immediate complications   (including critical care time)  DIAGNOSTIC STUDIES: Oxygen Saturation is 95% on RA, adequate by my interpretation.    COORDINATION OF CARE: 2:22 AM Discussed treatment plan with pt at bedside and pt agreed to plan.   Labs Review Labs Reviewed  BASIC METABOLIC PANEL - Abnormal; Notable for the following:    Glucose, Bld 100 (*)    All other components within normal limits  I-STAT CHEM 8, ED - Abnormal; Notable for the following:    BUN 21 (*)    All other components within normal limits  CBC WITH DIFFERENTIAL/PLATELET    Imaging Review No results found.    EKG Interpretation None      MDM   Final diagnoses:  Epistaxis    Patient presents for with recurrent epistaxis since yesterday. He is on heart transplant patient and his concern given the bleeding. Currently no active bleeding noted. Mild friable tissue noted over the right nasal septum. Patient was given Afrin and cauterized with silver nitrate. No recurrence of bleeding. Lab work and hemoglobin is reassuring. He has follow-up later on today.  After history, exam, and medical workup I feel the patient has been appropriately medically screened and is safe for discharge home. Pertinent diagnoses were discussed with the patient. Patient was given return precautions.   I personally performed the services described in this documentation, which was scribed in my presence. The recorded information has been reviewed and is accurate.   Barbette Hair  Edana Aguado, MD 11/07/15 0300

## 2015-11-07 NOTE — Discharge Instructions (Signed)

## 2015-11-15 ENCOUNTER — Telehealth: Payer: Self-pay | Admitting: Family Medicine

## 2015-11-15 ENCOUNTER — Ambulatory Visit: Payer: Medicare Other | Admitting: Family Medicine

## 2015-11-15 NOTE — Telephone Encounter (Signed)
Patient was scheduled for a follow up from the ER this morning.  Patient forgot about the appointment.  Patient said he went to the ER for a nosebleed.  Patient said he had a minor nosebleed 2 days after he went to the ER, but he hasn't had one since then and he's feeling better.  Does patient need to reschedule the appointment and can his appointment today be cancelled.? Patient said he had a heart catheterization on 11/12/15 and he said everything was fine and he goes back for a follow up on 11/19/15 with his heart transplant doctor at [facility].

## 2015-11-15 NOTE — Telephone Encounter (Signed)
Noted. Thanks for the update. Had cauterization in ER. Ok to cancel today's appt. Let us know if any recurrent bleed otherwise no f/u needed.

## 2015-11-15 NOTE — Telephone Encounter (Signed)
I left a message on patient's voice mail with Dr.Gutierrez' comments.

## 2016-01-09 ENCOUNTER — Telehealth: Payer: Self-pay

## 2016-01-09 MED ORDER — COLCRYS 0.6 MG PO TABS: 0.6000 mg | ORAL_TABLET | Freq: Every day | ORAL | Status: DC | PRN

## 2016-01-09 NOTE — Telephone Encounter (Signed)
Pt left v/m; pts ins will no longer cover colchicine but will cover colcrys; pt request colcrys to [facility]; pt has gout episode now. Do not see on current or hx med list where colchicine or colcrys prescribed. Last annual 01/15/15 and do not see notation of gout.Please advise.Pt request cb. Would pt need to be seen?

## 2016-01-09 NOTE — Telephone Encounter (Signed)
Patient notified and verbalized understanding. 

## 2016-01-09 NOTE — Telephone Encounter (Signed)
colcrys sent in. Come in if no better with treatment.

## 2016-04-14 ENCOUNTER — Encounter: Payer: Self-pay | Admitting: Internal Medicine

## 2016-04-14 ENCOUNTER — Ambulatory Visit (INDEPENDENT_AMBULATORY_CARE_PROVIDER_SITE_OTHER): Payer: Medicare Other | Admitting: Internal Medicine

## 2016-04-14 VITALS — BP 118/76 | HR 102 | Temp 99.5°F | Wt 181.5 lb

## 2016-04-14 DIAGNOSIS — R350 Frequency of micturition: Secondary | ICD-10-CM | POA: Diagnosis not present

## 2016-04-14 DIAGNOSIS — R3915 Urgency of urination: Secondary | ICD-10-CM

## 2016-04-14 DIAGNOSIS — R300 Dysuria: Secondary | ICD-10-CM | POA: Diagnosis not present

## 2016-04-14 DIAGNOSIS — R509 Fever, unspecified: Secondary | ICD-10-CM

## 2016-04-14 LAB — POC URINALSYSI DIPSTICK (AUTOMATED)
BILIRUBIN UA: NEGATIVE
Blood, UA: NEGATIVE
GLUCOSE UA: NEGATIVE
LEUKOCYTES UA: NEGATIVE
Nitrite, UA: NEGATIVE
Protein, UA: NEGATIVE
Spec Grav, UA: 1.02
Urobilinogen, UA: NEGATIVE
pH, UA: 7.5

## 2016-04-14 MED ORDER — CIPROFLOXACIN HCL 250 MG PO TABS: 250.0000 mg | ORAL_TABLET | Freq: Two times a day (BID) | ORAL | Status: DC

## 2016-04-14 NOTE — Patient Instructions (Signed)

## 2016-04-14 NOTE — Addendum Note (Signed)
Addended by: Lurlean Nanny on: 04/14/2016 03:44 PM   Modules accepted: Orders

## 2016-04-14 NOTE — Progress Notes (Signed)
HPI  Pt presents to the clinic today with c/o urinary symptoms. He has a 3 days history of increased frequency, urgency and burning when he urinates. Today he reports fevers, chills and body aches. He denies hematuria, abdominal pain, or discharge. He has normal BMs. PMH significant for h/o heart transplant, for which patient is on Cellcept and Prograf. He has his annual urology appointment later this month.   Review of Systems  Past Medical History  Diagnosis Date  . HTN (hypertension)   . Gout   . CHF (congestive heart failure) ([place])     ?HTN related  . S/P orthotopic heart transplant ([place]) 11/29/08    due to heart failure,cardiomyopathy  . 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 History of chicken pox   . History of shingles 2012  . OSA on CPAP   . Osteoporosis     per [facility] records on fosamax    Family History  Problem Relation Age of Onset  . Gout Mother   . Gout Brother   . Hypertension Brother   . Hypertension Mother   . Heart disease Mother   . Heart disease Father   . Diabetes Brother   . Cancer Neg Hx   . Stroke Neg Hx     Social History   Social History  . Marital Status: Divorced    Spouse Name: N/A  . Number of Children: N/A  . Years of Education: N/A   Occupational History  . Not on file.   Social History Main Topics  . Smoking status: Never Smoker   . Smokeless tobacco: Never Used  . Alcohol Use: No  . Drug Use: No  . Sexual Activity: Not on file   Other Topics Concern  . Not on file   Social History Narrative   Lives alone   Son lives nearby - as well as extended family in [place].   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 Aldactone [Spironolactone] Other (See Comments)    ? Breathing problems  . Amiodarone Other (See Comments)    Gynocomastia    Constitutional: Positive for fever, chills, and body  aches. Denies headache or abrupt weight changes.   GU: Pt reports urgency, frequency and burning with urination. Denies blood in urine, odor or discharge. Skin: Denies redness, rashes, lesions or ulcercations.   No other specific complaints in a complete review of systems (except as listed in HPI above).    Objective:   Physical Exam  BP 118/76 mmHg  Pulse 102  Temp(Src) 99.5 F (37.5 C) (Oral)  Wt 181 lb 8 oz (82.328 kg)  SpO2 98% Wt Readings from Last 3 Encounters:  04/14/16 181 lb 8 oz (82.328 kg)  08/30/15 181 lb (82.101 kg)  08/02/15 180 lb 12 oz (81.988 kg)    General: Appears his stated age, well developed, well nourished in NAD. Cardiovascular: Tachycardiac at 102 with normal rhythm. S1,S2 noted.   Pulmonary/Chest: Normal effort and positive vesicular breath sounds. No respiratory distress. No wheezes, rales or ronchi noted.  Abdomen: Soft. Normal bowel sounds. No distention or masses noted.  No tenderness to palpation over the bladder area. No CVA tenderness.      Assessment & Plan:   Urgency, Frequency, Dysuria secondary to possible UTI  Urinalysis: trace ketones and protein Urine Culture sent eRx  sent if for  Ciprofloxacin 250 mg BID x 7 days (due to patient's immunocompromised status, treating possible UTI until urine culture is back) Drink plenty of fluids  RTC as needed or if symptoms persist.

## 2016-04-16 LAB — URINE CULTURE
COLONY COUNT: NO GROWTH
Organism ID, Bacteria: NO GROWTH

## 2016-06-21 ENCOUNTER — Encounter: Payer: Self-pay | Admitting: Family Medicine

## 2016-06-21 DIAGNOSIS — N400 Enlarged prostate without lower urinary tract symptoms: Secondary | ICD-10-CM | POA: Insufficient documentation

## 2016-06-21 DIAGNOSIS — R972 Elevated prostate specific antigen [PSA]: Secondary | ICD-10-CM

## 2016-06-21 HISTORY — DX: Elevated prostate specific antigen (PSA): R97.20

## 2016-07-04 ENCOUNTER — Emergency Department (HOSPITAL_COMMUNITY): Payer: Medicare Other

## 2016-07-04 ENCOUNTER — Emergency Department (HOSPITAL_COMMUNITY)
Admission: EM | Admit: 2016-07-04 | Discharge: 2016-07-04 | Disposition: A | Discharge status: Home / Self Care | Payer: Medicare Other | Attending: Emergency Medicine | Admitting: Emergency Medicine

## 2016-07-04 ENCOUNTER — Encounter (HOSPITAL_COMMUNITY): Payer: Self-pay | Admitting: Emergency Medicine

## 2016-07-04 DIAGNOSIS — Z7982 Long term (current) use of aspirin: Secondary | ICD-10-CM | POA: Diagnosis not present

## 2016-07-04 DIAGNOSIS — I110 Hypertensive heart disease with heart failure: Secondary | ICD-10-CM | POA: Diagnosis not present

## 2016-07-04 DIAGNOSIS — I509 Heart failure, unspecified: Secondary | ICD-10-CM | POA: Diagnosis not present

## 2016-07-04 DIAGNOSIS — R1031 Right lower quadrant pain: Secondary | ICD-10-CM | POA: Diagnosis present

## 2016-07-04 DIAGNOSIS — N201 Calculus of ureter: Secondary | ICD-10-CM | POA: Diagnosis not present

## 2016-07-04 DIAGNOSIS — N200 Calculus of kidney: Secondary | ICD-10-CM

## 2016-07-04 LAB — CBC
HCT: 47 % (ref 39.0–52.0)
HEMOGLOBIN: 15.3 g/dL (ref 13.0–17.0)
MCH: 29.8 pg (ref 26.0–34.0)
MCHC: 32.6 g/dL (ref 30.0–36.0)
MCV: 91.6 fL (ref 78.0–100.0)
Platelets: 210 10*3/uL (ref 150–400)
RBC: 5.13 MIL/uL (ref 4.22–5.81)
RDW: 13.2 % (ref 11.5–15.5)
WBC: 8.8 10*3/uL (ref 4.0–10.5)

## 2016-07-04 LAB — COMPREHENSIVE METABOLIC PANEL
ALK PHOS: 55 U/L (ref 38–126)
ALT: 14 U/L — ABNORMAL LOW (ref 17–63)
ANION GAP: 9 (ref 5–15)
AST: 19 U/L (ref 15–41)
Albumin: 4.5 g/dL (ref 3.5–5.0)
BILIRUBIN TOTAL: 1 mg/dL (ref 0.3–1.2)
BUN: 14 mg/dL (ref 6–20)
CALCIUM: 9.9 mg/dL (ref 8.9–10.3)
CO2: 20 mmol/L — ABNORMAL LOW (ref 22–32)
Chloride: 109 mmol/L (ref 101–111)
Creatinine, Ser: 1.23 mg/dL (ref 0.61–1.24)
GFR calc non Af Amer: 59 mL/min — ABNORMAL LOW (ref >60)
Glucose, Bld: 146 mg/dL — ABNORMAL HIGH (ref 65–99)
Potassium: 4.6 mmol/L (ref 3.5–5.1)
SODIUM: 138 mmol/L (ref 135–145)
TOTAL PROTEIN: 8 g/dL (ref 6.5–8.1)

## 2016-07-04 LAB — URINALYSIS, ROUTINE W REFLEX MICROSCOPIC
Bilirubin Urine: NEGATIVE
Glucose, UA: NEGATIVE mg/dL
Ketones, ur: 15 mg/dL — AB
LEUKOCYTES UA: NEGATIVE
NITRITE: NEGATIVE
Protein, ur: 100 mg/dL — AB
SPECIFIC GRAVITY, URINE: 1.016 (ref 1.005–1.030)
pH: 8 (ref 5.0–8.0)

## 2016-07-04 LAB — URINE MICROSCOPIC-ADD ON

## 2016-07-04 LAB — LIPASE, BLOOD: Lipase: 23 U/L (ref 11–51)

## 2016-07-04 LAB — LACTIC ACID, PLASMA: LACTIC ACID, VENOUS: 1.4 mmol/L (ref 0.5–1.9)

## 2016-07-04 MED ORDER — MORPHINE SULFATE (PF) 4 MG/ML IV SOLN
6.0000 mg | Freq: Once | INTRAVENOUS | Status: AC
  Administered 2016-07-04: 6 mg via INTRAVENOUS
  Filled 2016-07-04: qty 2

## 2016-07-04 MED ORDER — ONDANSETRON HCL 4 MG/2ML IJ SOLN
4.0000 mg | Freq: Once | INTRAMUSCULAR | Status: AC | PRN
  Administered 2016-07-04: 4 mg via INTRAVENOUS
  Filled 2016-07-04: qty 2

## 2016-07-04 MED ORDER — KETOROLAC TROMETHAMINE 15 MG/ML IJ SOLN
15.0000 mg | Freq: Once | INTRAMUSCULAR | Status: AC
  Administered 2016-07-04: 15 mg via INTRAVENOUS
  Filled 2016-07-04: qty 1

## 2016-07-04 MED ORDER — ONDANSETRON HCL 4 MG PO TABS: 4.0000 mg | ORAL_TABLET | Freq: Four times a day (QID) | ORAL | 0 refills | Status: DC

## 2016-07-04 MED ORDER — IOPAMIDOL (ISOVUE-300) INJECTION 61%
INTRAVENOUS | Status: AC
  Administered 2016-07-04: 100 mL
  Filled 2016-07-04: qty 100

## 2016-07-04 MED ORDER — SODIUM CHLORIDE 0.9 % IV BOLUS (SEPSIS)
500.0000 mL | Freq: Once | INTRAVENOUS | Status: AC
  Administered 2016-07-04: 500 mL via INTRAVENOUS

## 2016-07-04 MED ORDER — HYDROCODONE-ACETAMINOPHEN 5-325 MG PO TABS: 2.0000 | ORAL_TABLET | ORAL | 0 refills | Status: DC | PRN

## 2016-07-04 MED ORDER — HYDROMORPHONE HCL 1 MG/ML IJ SOLN
1.0000 mg | Freq: Once | INTRAMUSCULAR | Status: AC
  Administered 2016-07-04: 1 mg via INTRAVENOUS
  Filled 2016-07-04: qty 1

## 2016-07-04 MED ORDER — IBUPROFEN 600 MG PO TABS: 600.0000 mg | ORAL_TABLET | Freq: Four times a day (QID) | ORAL | 0 refills | Status: DC | PRN

## 2016-07-04 NOTE — ED Triage Notes (Signed)
Pt c/o severe abdominal pain RLQ since this morning, pt is tender to palpation.

## 2016-07-04 NOTE — ED Provider Notes (Signed)
[facility] DEPT Provider Note   CSN: [id number] Arrival date & time: 07/04/16  1342  First Provider Contact:  [phone]   History   Chief Complaint Chief Complaint  Patient presents with  . Abdominal Pain    HPI Frederick Stephens is a 70 y.o. male.  The history is provided by the patient and a relative. No language interpreter was used.  Abdominal Pain   The current episode started 6 to 12 hours ago. The problem occurs constantly. The problem has been gradually worsening. The pain is located in the RUQ and RLQ. The pain is at a severity of 9/10. The pain is severe. Associated symptoms include anorexia and nausea. Pertinent negatives include fever, belching, diarrhea, flatus, hematochezia, melena, vomiting, constipation, dysuria, frequency, hematuria, headaches, arthralgias and myalgias. The symptoms are aggravated by certain positions and palpation. Nothing relieves the symptoms. Past workup does not include GI consult, CT scan, ultrasound or surgery. His past medical history does not include PUD, gallstones, GERD, ulcerative colitis, Crohn's disease or irritable bowel syndrome.    Past Medical History:  Diagnosis Date  . CHF (congestive heart failure) ([place])    ?HTN related  . 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 Elevated PSA 06/21/2016   40s - s/p benign biopsy 2017 followed closely by Dr Karsten Ro urology   . Gout   . History of chicken pox   . History of shingles 2012  . HTN (hypertension)   . OSA on CPAP   . Osteoporosis    per [facility] records on fosamax  . 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 Elevated PSA 06/21/2016  . Anal itching 08/30/2015  . Sinus problem 08/02/2015  . Hypomagnesemia 06/11/2015  . Osteoporosis   . OSA on CPAP   . Medicare annual wellness visit, subsequent 01/15/2015  . Advanced care planning/counseling discussion 01/15/2015  . Health  maintenance examination 01/15/2015  . Hearing loss due to cerumen impaction 01/15/2015  . CAP (community acquired pneumonia) 01/03/2014  . Essential tremor 10/19/2013  . HTN (hypertension)   . Gout   . CHF (congestive heart failure) ([place])   . H/O heart transplant ([place]) 11/29/2008    Past Surgical History:  Procedure Laterality Date  . COLONOSCOPY  10/2014   [facility])  . dobutamine stress test  11/2014   normal  . HEART TRANSPLANT  11/29/08   [facility] (Dr Tereasa Coop ONeill IV)  . HEART TRANSPLANT         Home Medications    Prior to Admission medications   Medication Sig Start Date End Date Taking? Authorizing Provider  alendronate (FOSAMAX) 70 MG tablet Take 70 mg by mouth once a week. Take with a full glass of water on an empty stomach.    Historical Provider, MD  amLODipine (NORVASC) 5 MG tablet Take 5 mg by mouth daily.    Historical Provider, MD  amoxicillin (AMOXIL) 500 MG capsule Take 2,000 mg by mouth as directed. Reported on 04/14/2016    Historical Provider, MD  aspirin 81 MG tablet Take 81 mg by mouth daily.    Historical Provider, MD  Calcium Carb-Cholecalciferol (CALCIUM-VITAMIN D) 600-400 MG-UNIT TABS Take 1 tablet by mouth daily.    Historical Provider, MD  ciprofloxacin (CIPRO) 250 MG tablet Take 1 tablet (250 mg total) by mouth 2 (two) times daily. 04/14/16   Jearld Fenton, NP  COLCRYS 0.6  MG tablet Take 1 tablet (0.6 mg total) by mouth daily as needed (gout flare). 01/09/16   Ria Bush, MD  HYDROcodone-acetaminophen (NORCO/VICODIN) 5-325 MG tablet Take 2 tablets by mouth every 4 (four) hours as needed. 07/04/16   Vira Blanco, MD  ibuprofen (ADVIL,MOTRIN) 600 MG tablet Take 1 tablet (600 mg total) by mouth every 6 (six) hours as needed. 07/04/16   Vira Blanco, MD  Magnesium 400 MG TABS Take 1 tablet by mouth 3 (three) times daily.    Historical Provider, MD  Mycophenolate Mofetil (CELLCEPT PO) Take 750 mg by mouth 2 (two) times daily.    Historical  Provider, MD  ondansetron (ZOFRAN) 4 MG tablet Take 1 tablet (4 mg total) by mouth every 6 (six) hours. 07/04/16   Vira Blanco, MD  pravastatin (PRAVACHOL) 40 MG tablet Take 1 tablet by mouth daily. 03/10/16   Historical Provider, MD  propranolol (INDERAL) 40 MG tablet Take by mouth as directed. 20 mg every morning and 40 mg every evening    Historical Provider, MD  tacrolimus (PROGRAF) 0.5 MG capsule Take 0.5 mg by mouth 2 (two) times daily.    Historical Provider, MD    Family History Family History  Problem Relation Age of Onset  . Gout Mother   . Hypertension Mother   . Heart disease Mother   . Gout Brother   . Hypertension Brother   . Heart disease Father   . Diabetes Brother   . Cancer Neg Hx   . Stroke Neg Hx     Social History Social History  Substance Use Topics  . Smoking status: Never Smoker  . Smokeless tobacco: Never Used  . Alcohol use No     Allergies   Aldactone [spironolactone] and Amiodarone   Review of Systems Review of Systems  Constitutional: Negative for chills and fever.  HENT: Negative for congestion, dental problem, drooling, ear discharge, ear pain and sore throat.   Eyes: Negative for pain and visual disturbance.  Respiratory: Negative for cough and shortness of breath.   Cardiovascular: Negative for chest pain and palpitations.  Gastrointestinal: Positive for abdominal pain, anorexia and nausea. Negative for blood in stool, constipation, diarrhea, flatus, hematochezia, melena, rectal pain and vomiting.  Genitourinary: Negative for dysuria, flank pain, frequency, hematuria, penile pain, penile swelling, scrotal swelling and testicular pain.  Musculoskeletal: Negative for arthralgias, back pain and myalgias.  Skin: Negative for color change and rash.  Neurological: Negative for seizures, syncope and headaches.  Psychiatric/Behavioral: Negative for agitation and behavioral problems.  All other systems reviewed and are negative.    Physical  Exam Updated Vital Signs BP 140/96   Pulse 88   Temp 98.7 F (37.1 C) (Oral)   Resp 17   SpO2 96%   Physical Exam  Constitutional: He appears well-developed and well-nourished.  HENT:  Head: Normocephalic and atraumatic.  Eyes: Conjunctivae are normal.  Neck: Neck supple.  Cardiovascular: Normal rate and regular rhythm.   No murmur heard. Pulmonary/Chest: Effort normal and breath sounds normal. No respiratory distress.  Abdominal: Soft. He exhibits no mass. There is tenderness. There is guarding. There is no rebound. No hernia. Hernia confirmed negative in the right inguinal area and confirmed negative in the left inguinal area.  Genitourinary: Testes normal. Right testis shows no mass, no swelling and no tenderness. Left testis shows no mass, no swelling and no tenderness.  Musculoskeletal: He exhibits no edema.  Neurological: He is alert.  Skin: Skin is warm and dry.  Psychiatric: He  has a normal mood and affect.  Nursing note and vitals reviewed.    ED Treatments / Results  Labs (all labs ordered are listed, but only abnormal results are displayed) Labs Reviewed  COMPREHENSIVE METABOLIC PANEL - Abnormal; Notable for the following:       Result Value   CO2 20 (*)    Glucose, Bld 146 (*)    ALT 14 (*)    GFR calc non Af Amer 59 (*)    All other components within normal limits  URINALYSIS, ROUTINE W REFLEX MICROSCOPIC (NOT AT [facility]) - Abnormal; Notable for the following:    Hgb urine dipstick MODERATE (*)    Ketones, ur 15 (*)    Protein, ur 100 (*)    All other components within normal limits  URINE MICROSCOPIC-ADD ON - Abnormal; Notable for the following:    Squamous Epithelial / LPF 0-5 (*)    Bacteria, UA RARE (*)    All other components within normal limits  LIPASE, BLOOD  CBC  LACTIC ACID, PLASMA  LACTIC ACID, PLASMA    EKG  EKG Interpretation  Date/Time:  Saturday July 04 2016 14:22:02 EDT Ventricular Rate:  81 PR Interval:    QRS Duration: 89 QT  Interval:  469 QTC Calculation: 545 R Axis:   80 Text Interpretation:  Sinus rhythm Low voltage, precordial leads Nonspecific T abnrm, anterolateral leads Prolonged QT interval No significant change was found Confirmed by Wyvonnia Dusky  MD, STEPHEN [phone]) on 07/04/2016 2:41:33 PM       Radiology Dg Chest 2 View  Result Date: 07/04/2016 CLINICAL DATA:  Right lower quadrant pain. EXAM: CHEST  2 VIEW COMPARISON:  January 03, 2014 FINDINGS: Minimal opacity in left lung base suggests atelectasis. No pulmonary nodules or masses. No focal infiltrates. The heart, hila, and mediastinum are normal. IMPRESSION: No active cardiopulmonary disease. Electronically Signed   By: Dorise Bullion III M.D   On: 07/04/2016 15:32  Ct Abdomen Pelvis W Contrast  Result Date: 07/04/2016 CLINICAL DATA:  Epigastric pain EXAM: CT ABDOMEN AND PELVIS WITH CONTRAST TECHNIQUE: Multidetector CT imaging of the abdomen and pelvis was performed using the standard protocol following bolus administration of intravenous contrast. CONTRAST:  193mL ISOVUE-300 IOPAMIDOL (ISOVUE-300) INJECTION 61% COMPARISON:  None. FINDINGS: Lower chest:  No acute findings. Hepatobiliary: Mild fatty infiltration of the liver is noted. A few scattered small cysts are seen. Pancreas: No mass, inflammatory changes, or other significant abnormality. Spleen: Within normal limits in size and appearance. Adrenals/Urinary Tract: The adrenal glands are within normal limits. The left kidney is within normal limits with the exception of renal cystic change. The right kidney demonstrates mild hydronephrosis and perinephric stranding secondary to a tiny 2 mm stone in the distal right ureter best seen on image number 83 of series 2. The bladder is well distended. Stomach/Bowel: No evidence of obstruction, inflammatory process, or abnormal fluid collections. The appendix is within normal limits. Vascular/Lymphatic: No pathologically enlarged lymph nodes. No evidence of abdominal  aortic aneurysm. Reproductive: No mass or other significant abnormality. Other: None. Musculoskeletal:  No suspicious bone lesions identified. IMPRESSION: Distal right ureteral stone with mild obstructive changes. Chronic changes as described above. Electronically Signed   By: Inez Catalina M.D.   On: 07/04/2016 16:35   Procedures Procedures (including critical care time)  Medications Ordered in ED Medications  ondansetron (ZOFRAN) injection 4 mg (4 mg Intravenous Given 07/04/16 1432)  morphine 4 MG/ML injection 6 mg (6 mg Intravenous Given 07/04/16 1556)  sodium  chloride 0.9 % bolus 500 mL (0 mLs Intravenous Stopped 07/04/16 1630)  iopamidol (ISOVUE-300) 61 % injection (100 mLs  Contrast Given 07/04/16 1532)  HYDROmorphone (DILAUDID) injection 1 mg (1 mg Intravenous Given 07/04/16 1643)  ketorolac (TORADOL) 15 MG/ML injection 15 mg (15 mg Intravenous Given 07/04/16 1723)     Initial Impression / Assessment and Plan / ED Course  I have reviewed the triage vital signs and the nursing notes.  Pertinent labs & imaging results that were available during my care of the patient were reviewed by me and considered in my medical decision making (see chart for details).  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 He endorses mild nausea as well. He's never had pain similar to this.  Vital signs stable. Patient moderate amount of discomfort. He has a normal heart and lung exam. There is no pulsatile abdominal mass. There is tenderness palpation with guarding in the right upper and right lower quadrant. No CVA tenderness on exam. Patient has a normal male GU exam no inguinal hernias.  Differential diagnosis includes renal stone versus AAA versus mesenteric ischemia versus appendicitis versus cholecystitis. Will treat pain with  morphine, Zofran for nausea, fluid bolus. Patient nothing by mouth we'll obtain CT scan of abdomen and pelvis with contrast.   4:43 PM: Patient reevaluated with improvement in his pain at a 6 out of 10. Will redose his pain medication while awaiting CT scan results.  CT scan with 2 mm stone, mild hydronephrosis. Kidney function at baseline. UA without evidence of infection. Patient's pain controlled following IV Dilaudid and Toradol. Patient able tolerate oral intake.  Patient stable for discharge home with instructions to use ibuprofen every 4-6 hours, Zofran as needed for nausea and Norco as needed for breakthrough pain. Patient was encouraged to strain his urine and see his primary care physician in one week. Return precautions for nausea, vomiting, worsening pain, inability to tolerate oral intake.  Patient and his son in agreement with treatment plan of further questions or concerns about discharge.  Discussed w/ my attending, Dr. Gilford Raid. Final Clinical Impressions(s) / ED Diagnoses   Final diagnoses:  Kidney stone    New Prescriptions New Prescriptions   HYDROCODONE-ACETAMINOPHEN (NORCO/VICODIN) 5-325 MG TABLET    Take 2 tablets by mouth every 4 (four) hours as needed.   IBUPROFEN (ADVIL,MOTRIN) 600 MG TABLET    Take 1 tablet (600 mg total) by mouth every 6 (six) hours as needed.   ONDANSETRON (ZOFRAN) 4 MG TABLET    Take 1 tablet (4 mg total) by mouth every 6 (six) hours.     Vira Blanco, MD 07/04/16 1734    Isla Pence, MD 07/04/16 Joen Laura

## 2016-07-06 ENCOUNTER — Telehealth: Payer: Self-pay | Admitting: Family Medicine

## 2016-07-06 NOTE — Telephone Encounter (Signed)
[facility]    --------------------------------------------------------------------------------   Patient Name: Frederick Stephens  Gender: Male  DOB: March 24, 1946   Age: 70 Y 79 M 3 D  Return Phone Number: [phone] (Primary)  Address:     City/State/Zip:  [place]     Client [facility] - Client  Client Site [facility] - Day  Physician Frederick Stephens - MD  Contact Type Call  Who Is Calling Patient / Member / Family / Caregiver  Call Type Triage / Clinical  Relationship To Patient Self  Return Phone Number [phone] (Primary)  Chief Complaint Abdominal Pain  Reason for Call Symptomatic / Request for Health Information  Appointment Disposition EMR Appointment Not Necessary  Info pasted into Epic Yes  PreDisposition Call Doctor  Translation No       Nurse Assessment  Nurse: Frederick Hailey, RN, Frederick Stephens Date/Time (Eastern Time): 07/06/2016 4:00:32 PM  Confirm and document reason for call. If symptomatic, describe symptoms. You must click the next button to save text entered. ---[name] states he has a kidney stone and is having pain in lower abdomen.    Has the patient traveled out of the country within the last 30 days? ---Not Applicable    Does the patient have any new or worsening symptoms? ---Yes    Will a triage be completed? ---Yes    Related visit to physician within the last 2 weeks? ---Yes    Does the PT have any chronic conditions? (i.e. diabetes, asthma, etc.) ---Yes    List chronic conditions. ---heart transplant 2009, prostate bx exam 1 month ago benign, hypertension, ASA 81mg     Is this a behavioral health or substance abuse call? ---No           Guidelines          Guideline Title Affirmed Question Affirmed Notes Nurse Date/Time (Eastern Time)  Flank Pain [1] SEVERE pain (e.g., excruciating, scale 8-10) AND [2] present >  1 hour    Evans, RN, Frederick Stephens 07/06/2016 4:03:44 PM    Disp. Time Frederick Stephens Time) Disposition Final User         07/06/2016 4:09:21 PM Go to ED Now Yes Frederick Hailey, RN, Frederick Stephens            [name] Understands: Yes  Disagree/Comply: Comply       Care Advice Given Per Guideline        GO TO ED NOW: You need to be seen in the Emergency Department. Go to the ER at ___________ [place] now. Drive carefully. DRIVING: Another adult should drive. BRING MEDICINES:        --------------------------------------------------------------------------------         Comments  User: Frederick Ina, RN Date/Time (Eastern Time): 07/06/2016 4:13:50 PM  [name] reports he is calling for an appt. to make it for tomorrow, advised caller would let the office know. [name] triaged based on pain 8/10 and he reports he is out of his pain medication to go to ER to be seen. [name] reports he is not sure he will go , has Ibuprophen 600mg  that he is taking q 6 hrs for the pain. Was diagnosed with kidney stone in the ER Saturday but is out of medicatoin and stone has not passed, continues to have flank pain.    Referrals  GO TO FACILITY UNDECIDED

## 2016-07-06 NOTE — Telephone Encounter (Signed)
plz schedule at 12:45pm if pt able to come in.

## 2016-07-07 ENCOUNTER — Ambulatory Visit (INDEPENDENT_AMBULATORY_CARE_PROVIDER_SITE_OTHER): Payer: Medicare Other | Admitting: Family Medicine

## 2016-07-07 ENCOUNTER — Encounter: Payer: Self-pay | Admitting: Family Medicine

## 2016-07-07 VITALS — BP 118/70 | HR 100 | Temp 98.2°F | Wt 175.5 lb

## 2016-07-07 DIAGNOSIS — N200 Calculus of kidney: Secondary | ICD-10-CM | POA: Insufficient documentation

## 2016-07-07 MED ORDER — TAMSULOSIN HCL 0.4 MG PO CAPS: 0.4000 mg | ORAL_CAPSULE | Freq: Every day | ORAL | 0 refills | Status: DC

## 2016-07-07 MED ORDER — HYDROCODONE-ACETAMINOPHEN 5-325 MG PO TABS: 1.0000 | ORAL_TABLET | Freq: Four times a day (QID) | ORAL | 0 refills | Status: DC | PRN

## 2016-07-07 NOTE — Assessment & Plan Note (Signed)
First kidney stone, should pass on its own. Reviewed dx and prevention strategies. Reviewed signs of infection/obstruction. Add flomax daily, continue limited ibuprofen and hydrocodone/apap. Continue straining urine.  Kidney stone preventative diet handout provided today.

## 2016-07-07 NOTE — Progress Notes (Signed)
BP 118/70   Pulse 100   Temp 98.2 F (36.8 C) (Oral)   Wt 175 lb 8 oz (79.6 kg)   BMI 25.73 kg/m    CC: kidney stone Subjective:    Patient ID: Frederick Stephens, male    DOB: 03/03/1946, 70 y.o.   MRN: [id number]  HPI: Frederick Stephens is a 70 y.o. male presenting on 07/07/2016 for Nephrolithiasis (hasn't passed; needs refill on pain meds)   Seen this past Saturday at ER with acute onset of R flank pin, CT scan showed 43mm distal R ureteral stone with mild obstructive changes. Treated with IV dilaudid and toradol. Discharged home to strain urine and started on ibuprofen, zofran, norco.   Still has not passed stone. sxs started Saturday 8am. Ran out of hydrocodone (#10). Has been taking ibuprofen regularly. This is first kidney stone.   H/o heart transplant 2009 on cellcept and prograf [facility].   Relevant past medical, surgical, family and social history reviewed and updated as indicated. Interim medical history since our last visit reviewed. Allergies and medications reviewed and updated. Current Outpatient Prescriptions on File Prior to Visit  Medication Sig  . alendronate (FOSAMAX) 70 MG tablet Take 70 mg by mouth every Friday. Take with a full glass of water on an empty stomach.   Marland Kitchen amLODipine (NORVASC) 2.5 MG tablet Take 2.5 mg by mouth 2 (two) times daily.  Marland Kitchen aspirin 81 MG tablet Take 81 mg by mouth daily.  . Calcium Carb-Cholecalciferol (CALCIUM-VITAMIN D) 600-400 MG-UNIT TABS Take 1 tablet by mouth daily.  Marland Kitchen COLCRYS 0.6 MG tablet Take 1 tablet (0.6 mg total) by mouth daily as needed (gout flare).  . hydrocortisone (ANUSOL-HC) 2.5 % rectal cream Place 1 application rectally daily as needed.  Marland Kitchen ibuprofen (ADVIL,MOTRIN) 600 MG tablet Take 1 tablet (600 mg total) by mouth every 6 (six) hours as needed.  . Magnesium 400 MG TABS Take 1 tablet by mouth 3 (three) times daily.  . mycophenolate (CELLCEPT) 250 MG capsule Take 500 mg by mouth 2 (two) times daily.  .  pravastatin (PRAVACHOL) 20 MG tablet Take 20 mg by mouth every evening.  . propranolol (INDERAL) 80 MG tablet Take 80 mg by mouth daily.  . tacrolimus (PROGRAF) 0.5 MG capsule Take 0.5 mg by mouth 2 (two) times daily.  Marland Kitchen amoxicillin (AMOXIL) 500 MG capsule Take 2,000 mg by mouth daily as needed (for dental procedures). Reported on 04/14/2016  . ondansetron (ZOFRAN) 4 MG tablet Take 1 tablet (4 mg total) by mouth every 6 (six) hours. (Patient not taking: Reported on 07/07/2016)   No current facility-administered medications on file prior to visit.     Review of Systems Per HPI unless specifically indicated in ROS section     Objective:    BP 118/70   Pulse 100   Temp 98.2 F (36.8 C) (Oral)   Wt 175 lb 8 oz (79.6 kg)   BMI 25.73 kg/m   Wt Readings from Last 3 Encounters:  07/07/16 175 lb 8 oz (79.6 kg)  04/14/16 181 lb 8 oz (82.3 kg)  08/30/15 181 lb (82.1 kg)    Physical Exam  Constitutional: He appears well-developed and well-nourished. No distress.  Psychiatric: He has a normal mood and affect.  Nursing note and vitals reviewed.  Results for orders placed or performed during the hospital encounter of 07/04/16  Lipase, blood  Result Value Ref Range   Lipase 23 11 - 51 U/L  Comprehensive metabolic panel  Result Value Ref Range   Sodium 138 135 - 145 mmol/L   Potassium 4.6 3.5 - 5.1 mmol/L   Chloride 109 101 - 111 mmol/L   CO2 20 (L) 22 - 32 mmol/L   Glucose, Bld 146 (H) 65 - 99 mg/dL   BUN 14 6 - 20 mg/dL   Creatinine, Ser 1.23 0.61 - 1.24 mg/dL   Calcium 9.9 8.9 - 10.3 mg/dL   Total Protein 8.0 6.5 - 8.1 g/dL   Albumin 4.5 3.5 - 5.0 g/dL   AST 19 15 - 41 U/L   ALT 14 (L) 17 - 63 U/L   Alkaline Phosphatase 55 38 - 126 U/L   Total Bilirubin 1.0 0.3 - 1.2 mg/dL   GFR calc non Af Amer 59 (L) >60 mL/min   GFR calc Af Amer >60 >60 mL/min   Anion gap 9 5 - 15  CBC  Result Value Ref Range   WBC 8.8 4.0 - 10.5 K/uL   RBC 5.13 4.22 - 5.81 MIL/uL   Hemoglobin 15.3 13.0 -  17.0 g/dL   HCT 47.0 39.0 - 52.0 %   MCV 91.6 78.0 - 100.0 fL   MCH 29.8 26.0 - 34.0 pg   MCHC 32.6 30.0 - 36.0 g/dL   RDW 13.2 11.5 - 15.5 %   Platelets 210 150 - 400 K/uL  Urinalysis, Routine w reflex microscopic  Result Value Ref Range   Color, Urine YELLOW YELLOW   APPearance CLEAR CLEAR   Specific Gravity, Urine 1.016 1.005 - 1.030   pH 8.0 5.0 - 8.0   Glucose, UA NEGATIVE NEGATIVE mg/dL   Hgb urine dipstick MODERATE (A) NEGATIVE   Bilirubin Urine NEGATIVE NEGATIVE   Ketones, ur 15 (A) NEGATIVE mg/dL   Protein, ur 100 (A) NEGATIVE mg/dL   Nitrite NEGATIVE NEGATIVE   Leukocytes, UA NEGATIVE NEGATIVE  Lactic acid, plasma  Result Value Ref Range   Lactic Acid, Venous 1.4 0.5 - 1.9 mmol/L  Urine microscopic-add on  Result Value Ref Range   Squamous Epithelial / LPF 0-5 (A) NONE SEEN   WBC, UA 0-5 0 - 5 WBC/hpf   RBC / HPF 6-30 0 - 5 RBC/hpf   Bacteria, UA RARE (A) NONE SEEN      Assessment & Plan:   Problem List Items Addressed This Visit    Right nephrolithiasis - Primary    First kidney stone, should pass on its own. Reviewed dx and prevention strategies. Reviewed signs of infection/obstruction. Add flomax daily, continue limited ibuprofen and hydrocodone/apap. Continue straining urine.  Kidney stone preventative diet handout provided today.       Relevant Medications   HYDROcodone-acetaminophen (NORCO/VICODIN) 5-325 MG tablet    Other Visit Diagnoses   None.      Follow up plan: Return if symptoms worsen or fail to improve.  Ria Bush, MD

## 2016-07-07 NOTE — Progress Notes (Signed)
Pre visit review using our clinic review tool, if applicable. No additional management support is needed unless otherwise documented below in the visit note. 

## 2016-07-07 NOTE — Patient Instructions (Addendum)
Continue straining urine. Hydrocodone refilled. May continue ibuprofen as needed as well.  Start flomax to help pass stone.  Try to get most calcium from diet. Goal 1200mg /day.  Increase water and fluids. Goal light yellow to clear urine daily.  Try to decrease the intake of oxalate, animal protein, sucrose, fructose, sodium, supplemental vitamin C, and supplemental calcium.   Dietary Guidelines to Help Prevent Kidney Stones Your risk of kidney stones can be decreased by adjusting the foods you eat. The most important thing you can do is drink enough fluid. You should drink enough fluid to keep your urine clear or pale yellow. The following guidelines provide specific information for the type of kidney stone you have had. GUIDELINES ACCORDING TO TYPE OF KIDNEY STONE Calcium Oxalate Kidney Stones  Reduce the amount of salt you eat. Foods that have a lot of salt cause your body to release excess calcium into your urine. The excess calcium can combine with a substance called oxalate to form kidney stones.  Reduce the amount of animal protein you eat if the amount you eat is excessive. Animal protein causes your body to release excess calcium into your urine. Ask your dietitian how much protein from animal sources you should be eating.  Avoid foods that are high in oxalates. If you take vitamins, they should have less than 500 mg of vitamin C. Your body turns vitamin C into oxalates. You do not need to avoid fruits and vegetables high in vitamin C. Calcium Phosphate Kidney Stones  Reduce the amount of salt you eat to help prevent the release of excess calcium into your urine.  Reduce the amount of animal protein you eat if the amount you eat is excessive. Animal protein causes your body to release excess calcium into your urine. Ask your dietitian how much protein from animal sources you should be eating.  Get enough calcium from food or take a calcium supplement (ask your dietitian for  recommendations). Food sources of calcium that do not increase your risk of kidney stones include:  Broccoli.  Dairy products, such as cheese and yogurt.  Pudding. Uric Acid Kidney Stones  Do not have more than 6 oz of animal protein per day. FOOD SOURCES Animal Protein Sources  Meat (all types).  Poultry.  Eggs.  Fish, seafood. Foods High in [employer] seasonings.  Soy sauce.  Teriyaki sauce.  Cured and processed meats.  Salted crackers and snack foods.  Fast food.  Canned soups and most canned foods. Foods High in Oxalates  Grains:  Amaranth.  Barley.  Grits.  Wheat germ.  Bran.  Buckwheat flour.  All bran cereals.  Pretzels.  Whole wheat bread.  Vegetables:  Beans (wax).  Beets and beet greens.  Collard greens.  Eggplant.  Escarole.  Leeks.  Okra.  Parsley.  Rutabagas.  Spinach.  Swiss chard.  Tomato paste.  Fried potatoes.  Sweet potatoes.  Fruits:  Red currants.  Figs.  Kiwi.  Rhubarb.  Meat and Other Protein Sources:  Beans (dried).  Soy burgers and other soybean products.  Miso.  Nuts (peanuts, almonds, pecans, cashews, hazelnuts).  Nut butters.  Sesame seeds and tahini (paste made of sesame seeds).  Poppy seeds.  Beverages:  Chocolate drink mixes.  Soy milk.  Instant iced tea.  Juices made from high-oxalate fruits or vegetables.  Other:  Carob.  Chocolate.  Fruitcake.  Marmalades.   This information is not intended to replace advice given to you by your health care provider. Make  sure you discuss any questions you have with your health care provider.   Document Released: 03/20/2011 Document Revised: 11/28/2013 Document Reviewed: 10/20/2013 Elsevier Interactive Patient Education [employer].

## 2016-07-07 NOTE — Telephone Encounter (Signed)
Spoke with patient and appt scheduled. 

## 2016-07-08 ENCOUNTER — Ambulatory Visit: Payer: Medicare Other | Admitting: Family Medicine

## 2016-07-08 DIAGNOSIS — Z0289 Encounter for other administrative examinations: Secondary | ICD-10-CM

## 2016-08-12 ENCOUNTER — Other Ambulatory Visit: Payer: Self-pay | Admitting: Family Medicine

## 2016-08-12 DIAGNOSIS — R972 Elevated prostate specific antigen [PSA]: Secondary | ICD-10-CM

## 2016-08-12 DIAGNOSIS — M109 Gout, unspecified: Secondary | ICD-10-CM

## 2016-08-12 DIAGNOSIS — I10 Essential (primary) hypertension: Secondary | ICD-10-CM

## 2016-08-12 DIAGNOSIS — M810 Age-related osteoporosis without current pathological fracture: Secondary | ICD-10-CM

## 2016-08-13 ENCOUNTER — Ambulatory Visit (INDEPENDENT_AMBULATORY_CARE_PROVIDER_SITE_OTHER): Payer: Medicare Other | Admitting: *Deleted

## 2016-08-13 ENCOUNTER — Other Ambulatory Visit (INDEPENDENT_AMBULATORY_CARE_PROVIDER_SITE_OTHER): Payer: Medicare Other

## 2016-08-13 ENCOUNTER — Ambulatory Visit: Payer: Medicare Other

## 2016-08-13 DIAGNOSIS — I10 Essential (primary) hypertension: Secondary | ICD-10-CM | POA: Diagnosis not present

## 2016-08-13 DIAGNOSIS — Z23 Encounter for immunization: Secondary | ICD-10-CM

## 2016-08-13 DIAGNOSIS — M810 Age-related osteoporosis without current pathological fracture: Secondary | ICD-10-CM | POA: Diagnosis not present

## 2016-08-13 DIAGNOSIS — M109 Gout, unspecified: Secondary | ICD-10-CM

## 2016-08-13 DIAGNOSIS — R972 Elevated prostate specific antigen [PSA]: Secondary | ICD-10-CM

## 2016-08-13 LAB — BASIC METABOLIC PANEL
BUN: 13 mg/dL (ref 6–23)
CALCIUM: 9.4 mg/dL (ref 8.4–10.5)
CO2: 30 mEq/L (ref 19–32)
CREATININE: 1.05 mg/dL (ref 0.40–1.50)
Chloride: 106 mEq/L (ref 96–112)
GFR: 90.15 mL/min (ref >60.00)
Glucose, Bld: 108 mg/dL — ABNORMAL HIGH (ref 70–99)
Potassium: 4.4 mEq/L (ref 3.5–5.1)
Sodium: 141 mEq/L (ref 135–145)

## 2016-08-13 LAB — LIPID PANEL
CHOL/HDL RATIO: 4
Cholesterol: 178 mg/dL (ref 0–200)
HDL: 40.7 mg/dL (ref >39.00)
LDL CALC: 100 mg/dL — AB (ref 0–99)
NONHDL: 137.42
Triglycerides: 185 mg/dL — ABNORMAL HIGH (ref 0.0–149.0)
VLDL: 37 mg/dL (ref 0.0–40.0)

## 2016-08-13 LAB — PSA: PSA: 2.31 ng/mL (ref 0.10–4.00)

## 2016-08-13 LAB — MAGNESIUM: Magnesium: 1.6 mg/dL (ref 1.5–2.5)

## 2016-08-13 LAB — URIC ACID: Uric Acid, Serum: 6.9 mg/dL (ref 4.0–7.8)

## 2016-08-13 LAB — VITAMIN D 25 HYDROXY (VIT D DEFICIENCY, FRACTURES): VITD: 24.11 ng/mL — ABNORMAL LOW (ref 30.00–100.00)

## 2016-08-20 ENCOUNTER — Ambulatory Visit (INDEPENDENT_AMBULATORY_CARE_PROVIDER_SITE_OTHER): Payer: Medicare Other | Admitting: Family Medicine

## 2016-08-20 ENCOUNTER — Encounter: Payer: Self-pay | Admitting: Family Medicine

## 2016-08-20 VITALS — BP 128/78 | HR 80 | Temp 98.3°F | Ht 69.25 in | Wt 178.2 lb

## 2016-08-20 DIAGNOSIS — E559 Vitamin D deficiency, unspecified: Secondary | ICD-10-CM

## 2016-08-20 DIAGNOSIS — Z0000 Encounter for general adult medical examination without abnormal findings: Secondary | ICD-10-CM

## 2016-08-20 DIAGNOSIS — M810 Age-related osteoporosis without current pathological fracture: Secondary | ICD-10-CM

## 2016-08-20 DIAGNOSIS — H6122 Impacted cerumen, left ear: Secondary | ICD-10-CM | POA: Diagnosis not present

## 2016-08-20 DIAGNOSIS — I509 Heart failure, unspecified: Secondary | ICD-10-CM

## 2016-08-20 DIAGNOSIS — Z0001 Encounter for general adult medical examination with abnormal findings: Secondary | ICD-10-CM | POA: Diagnosis not present

## 2016-08-20 DIAGNOSIS — Z7189 Other specified counseling: Secondary | ICD-10-CM

## 2016-08-20 DIAGNOSIS — Z23 Encounter for immunization: Secondary | ICD-10-CM

## 2016-08-20 DIAGNOSIS — I10 Essential (primary) hypertension: Secondary | ICD-10-CM

## 2016-08-20 DIAGNOSIS — R972 Elevated prostate specific antigen [PSA]: Secondary | ICD-10-CM

## 2016-08-20 DIAGNOSIS — Z941 Heart transplant status: Secondary | ICD-10-CM

## 2016-08-20 NOTE — Progress Notes (Signed)
Pre visit review using our clinic review tool, if applicable. No additional management support is needed unless otherwise documented below in the visit note. 

## 2016-08-20 NOTE — Progress Notes (Signed)
BP 128/78   Pulse 80   Temp 98.3 F (36.8 C) (Oral)   Ht 5' 9.25" (1.759 m)   Wt 178 lb 4 oz (80.9 kg)   BMI 26.13 kg/m    CC: medicare wellness visit Subjective:    Patient ID: Frederick Stephens, male    DOB: Nov 18, 1946, 70 y.o.   MRN: [id number]  HPI: Frederick Stephens is a 69 y.o. male presenting on 08/20/2016 for Annual Exam   Planning marriage over the next year.  S/p heart transplant for CHF with cardiomyopathy - cardiologist at [facility] (Dr. Rosalin Hawking and Leonides Schanz). On prograf and cellcept - taking fosamax once weekly for osteoporosis. Has stress test regularly.  Kidney stone finally passed last month.   Some R lateral hip pain without radiation with walking. Ongoing for the last year.   Failed hearing screen - L ear Eye screen with eye doctor No falls in last year Depression screen - PHQ9 = 6.  Preventative: Colonoscopy - normal 10/2014 [facility]) Prostate - sees urologist yearly. PSA 3.97 11/2014. H/o BPH. Ongoing pain with ejaculation.  DEXA - T -2.5 hip (osteoporosis)   Flu shot - yearly Prevnar 2016. Pneumovax today Tetanus - declines zostavax - not candidate Advanced planning: has living will at home. HCPOA is sister Kavin Leech. Will bring me copy Seat belt use discussed Sunscreen use discussed. No changing moles on skin.   Lives alone Son lives nearby - as well as extended family in [place]. 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 Interim medical history since our last visit reviewed. Allergies and medications reviewed and updated. Current Outpatient Prescriptions on File Prior to Visit  Medication Sig  . alendronate (FOSAMAX) 70 MG tablet Take 70 mg by mouth every Friday. Take with a full glass of water on an empty stomach.   Marland Kitchen amLODipine (NORVASC) 2.5 MG tablet Take 2.5 mg  by mouth 2 (two) times daily.  Marland Kitchen aspirin 81 MG tablet Take 81 mg by mouth daily.  . Calcium Carb-Cholecalciferol (CALCIUM-VITAMIN D) 600-400 MG-UNIT TABS Take 1 tablet by mouth daily.  Marland Kitchen COLCRYS 0.6 MG tablet Take 1 tablet (0.6 mg total) by mouth daily as needed (gout flare).  Marland Kitchen HYDROcodone-acetaminophen (NORCO/VICODIN) 5-325 MG tablet Take 1 tablet by mouth every 6 (six) hours as needed for moderate pain.  . hydrocortisone (ANUSOL-HC) 2.5 % rectal cream Place 1 application rectally daily as needed.  Marland Kitchen ibuprofen (ADVIL,MOTRIN) 600 MG tablet Take 1 tablet (600 mg total) by mouth every 6 (six) hours as needed.  . Magnesium 400 MG TABS Take 1 tablet by mouth 3 (three) times daily.  . mycophenolate (CELLCEPT) 250 MG capsule Take 500 mg by mouth 2 (two) times daily.  . ondansetron (ZOFRAN) 4 MG tablet Take 1 tablet (4 mg total) by mouth every 6 (six) hours.  . pravastatin (PRAVACHOL) 20 MG tablet Take 20 mg by mouth every evening.  . propranolol (INDERAL) 80 MG tablet Take 80 mg by mouth daily.  . tacrolimus (PROGRAF) 0.5 MG capsule Take 0.5 mg by mouth 2 (two) times daily.  Marland Kitchen amoxicillin (AMOXIL) 500 MG capsule Take 2,000 mg by mouth daily as needed (for dental procedures). Reported on 04/14/2016   No current facility-administered medications on file prior to visit.     Review of Systems  Constitutional: Negative for activity change, appetite change, chills, fatigue,  fever and unexpected weight change.  HENT: Negative for hearing loss.   Eyes: Negative for visual disturbance.  Respiratory: Negative for cough, chest tightness, shortness of breath and wheezing.   Cardiovascular: Negative for chest pain, palpitations and leg swelling.  Gastrointestinal: Negative for abdominal distention, abdominal pain, blood in stool, constipation, diarrhea, nausea and vomiting.  Genitourinary: Negative for difficulty urinating and hematuria.  Musculoskeletal: Negative for arthralgias, myalgias and neck pain.    Skin: Negative for rash.  Neurological: Negative for dizziness, seizures, syncope and headaches.  Hematological: Negative for adenopathy. Does not bruise/bleed easily.  Psychiatric/Behavioral: Negative for dysphoric mood. The patient is not nervous/anxious.    Per HPI unless specifically indicated in ROS section     Objective:    BP 128/78   Pulse 80   Temp 98.3 F (36.8 C) (Oral)   Ht 5' 9.25" (1.759 m)   Wt 178 lb 4 oz (80.9 kg)   BMI 26.13 kg/m   Wt Readings from Last 3 Encounters:  08/20/16 178 lb 4 oz (80.9 kg)  07/07/16 175 lb 8 oz (79.6 kg)  04/14/16 181 lb 8 oz (82.3 kg)    Physical Exam  Constitutional: He is oriented to person, place, and time. He appears well-developed and well-nourished. No distress.  HENT:  Head: Normocephalic and atraumatic.  Right Ear: Tympanic membrane, external ear and ear canal normal.  Left Ear: External ear normal. Decreased hearing is noted.  Nose: Nose normal.  Mouth/Throat: Uvula is midline, oropharynx is clear and moist and mucous membranes are normal. No oropharyngeal exudate, posterior oropharyngeal edema or posterior oropharyngeal erythema.  L TM covered by cerumen Failed manual cerumen removal so irrigation successfully performed  Eyes: Conjunctivae and EOM are normal. Pupils are equal, round, and reactive to light. No scleral icterus.  Neck: Normal range of motion. Neck supple. Carotid bruit is not present. No thyromegaly present.  Cardiovascular: Normal rate, regular rhythm, normal heart sounds and intact distal pulses.   No murmur heard. Pulses:      Radial pulses are 2+ on the right side, and 2+ on the left side.  Pulmonary/Chest: Effort normal and breath sounds normal. No respiratory distress. He has no wheezes. He has no rales.  Abdominal: Soft. Bowel sounds are normal. He exhibits no distension and no mass. There is no tenderness. There is no rebound and no guarding.  Musculoskeletal: Normal range of motion. He exhibits no  edema.  Lymphadenopathy:    He has no cervical adenopathy.  Neurological: He is alert and oriented to person, place, and time.  CN grossly intact, station and gait intact  Skin: Skin is warm and dry. No rash noted.  Psychiatric: He has a normal mood and affect. His behavior is normal. Judgment and thought content normal.  Nursing note and vitals reviewed.  Results for orders placed or performed in visit on 08/13/16  Lipid panel  Result Value Ref Range   Cholesterol 178 0 - 200 mg/dL   Triglycerides 185.0 (H) 0.0 - 149.0 mg/dL   HDL 40.70 >39.00 mg/dL   VLDL 37.0 0.0 - 40.0 mg/dL   LDL Cholesterol 100 (H) 0 - 99 mg/dL   Total CHOL/HDL Ratio 4    NonHDL [id number]   Basic metabolic panel  Result Value Ref Range   Sodium 141 135 - 145 mEq/L   Potassium 4.4 3.5 - 5.1 mEq/L   Chloride 106 96 - 112 mEq/L   CO2 30 19 - 32 mEq/L   Glucose, Bld 108 (H) 70 -  99 mg/dL   BUN 13 6 - 23 mg/dL   Creatinine, Ser 1.05 0.40 - 1.50 mg/dL   Calcium 9.4 8.4 - 10.5 mg/dL   GFR 90.15 >60.00 mL/min  PSA  Result Value Ref Range   PSA 2.31 0.10 - 4.00 ng/mL  VITAMIN D 25 Hydroxy (Vit-D Deficiency, Fractures)  Result Value Ref Range   VITD 24.11 (L) 30.00 - 100.00 ng/mL  Uric acid  Result Value Ref Range   Uric Acid, Serum 6.9 4.0 - 7.8 mg/dL  Magnesium  Result Value Ref Range   Magnesium 1.6 1.5 - 2.5 mg/dL      Assessment & Plan:   Problem List Items Addressed This Visit    Advanced care planning/counseling discussion    Advanced planning: has living will at home. HCPOA is sister Kavin Leech. Will bring me copy      CHF (congestive heart failure) (HCC)    Stable period, seems euvolemic.      Elevated PSA    PSA reassuring today. DRE not performed.      H/O heart transplant [facility])    Followed closely by [facility] cardiologist and surgeon. On cellcept and prograf.       Health maintenance examination    Preventative protocols reviewed and updated unless pt declined. Discussed healthy  diet and lifestyle.       Hearing loss due to cerumen impaction    L irrigation successfully performed.       HTN (hypertension)    Chronic, stable. Continue current regimen.       Hypomagnesemia    Continue magnesium TID.      Medicare annual wellness visit, subsequent - Primary    I have personally reviewed the Medicare Annual Wellness questionnaire and have noted 1. The patient's medical and social history 2. Their use of alcohol, tobacco or illicit drugs 3. Their current medications and supplements 4. The patient's functional ability including ADL's, fall risks, home safety risks and hearing or visual impairment. Cognitive function has been assessed and addressed as indicated.  5. Diet and physical activity 6. Evidence for depression or mood disorders The patients weight, height, BMI have been recorded in the chart. I have made referrals, counseling and provided education to the patient based on review of the above and I have provided the pt with a written personalized care plan for preventive services. Provider list updated.. See scanned questionairre as needed for further documentation. Reviewed preventative protocols and updated unless pt declined.       Osteoporosis    Continue fosamax. Unsure latest DEXA.      Relevant Medications   cholecalciferol (VITAMIN D) 1000 units tablet   Vitamin D deficiency    Add 1000 IU vit D to daily regimen.        Other Visit Diagnoses    Need for 23-polyvalent pneumococcal polysaccharide vaccine       Relevant Orders   Pneumococcal polysaccharide vaccine 23-valent greater than or equal to 2yo subcutaneous/IM (Completed)       Follow up plan: Return in about 1 year (around 08/20/2017) for medicare wellness visit.  Ria Bush, MD

## 2016-08-20 NOTE — Patient Instructions (Addendum)
Pneumovax today. Ear irrigation performed today.  Good to see you today, call us with questions. Bring me copy of your living will to update your chart.  Add extra vitamin D 1000 units daily. You are doing well today. Return as needed or in 1 year for next physical.   Health Maintenance, Male A healthy lifestyle and preventative care can promote health and wellness.  Maintain regular health, dental, and eye exams.  Eat a healthy diet. Foods like vegetables, fruits, whole grains, low-fat dairy products, and lean protein foods contain the nutrients you need and are low in calories. Decrease your intake of foods high in solid fats, added sugars, and salt. Get information about a proper diet from your health care provider, if necessary.  Regular physical exercise is one of the most important things you can do for your health. Most adults should get at least 150 minutes of moderate-intensity exercise (any activity that increases your heart rate and causes you to sweat) each week. In addition, most adults need muscle-strengthening exercises on 2 or more days a week.   Maintain a healthy weight. The body mass index (BMI) is a screening tool to identify possible weight problems. It provides an estimate of body fat based on height and weight. Your health care provider can find your BMI and can help you achieve or maintain a healthy weight. For males 20 years and older:  A BMI below 18.5 is considered underweight.  A BMI of 18.5 to 24.9 is normal.  A BMI of 25 to 29.9 is considered overweight.  A BMI of 30 and above is considered obese.  Maintain normal blood lipids and cholesterol by exercising and minimizing your intake of saturated fat. Eat a balanced diet with plenty of fruits and vegetables. Blood tests for lipids and cholesterol should begin at age 51 and be repeated every 5 years. If your lipid or cholesterol levels are high, you are over age 1, or you are at high risk for heart disease, you  may need your cholesterol levels checked more frequently.Ongoing high lipid and cholesterol levels should be treated with medicines if diet and exercise are not working.  If you smoke, find out from your health care provider how to quit. If you do not use tobacco, do not start.  Lung cancer screening is recommended for adults aged 92-80 years who are at high risk for developing lung cancer because of a history of smoking. A yearly low-dose CT scan of the lungs is recommended for people who have at least a 30-pack-year history of smoking and are current smokers or have quit within the past 15 years. A pack year of smoking is smoking an average of 1 pack of cigarettes a day for 1 year (for example, a 30-pack-year history of smoking could mean smoking 1 pack a day for 30 years or 2 packs a day for 15 years). Yearly screening should continue until the smoker has stopped smoking for at least 15 years. Yearly screening should be stopped for people who develop a health problem that would prevent them from having lung cancer treatment.  If you choose to drink alcohol, do not have more than 2 drinks per day. One drink is considered to be 12 oz (360 mL) of beer, 5 oz (150 mL) of wine, or 1.5 oz (45 mL) of liquor.  Avoid the use of street drugs. Do not share needles with anyone. Ask for help if you need support or instructions about stopping the use of drugs.  High blood pressure causes heart disease and increases the risk of stroke. High blood pressure is more likely to develop in:  People who have blood pressure in the end of the normal range (100-139/85-89 mm Hg).  People who are overweight or obese.  People who are African American.  If you are 85-53 years of age, have your blood pressure checked every 3-5 years. If you are 56 years of age or older, have your blood pressure checked every year. You should have your blood pressure measured twice--once when you are at a hospital or clinic, and once when you  are not at a hospital or clinic. Record the average of the two measurements. To check your blood pressure when you are not at a hospital or clinic, you can use:  An automated blood pressure machine at a pharmacy.  A home blood pressure monitor.  If you are 47-53 years old, ask your health care provider if you should take aspirin to prevent heart disease.  Diabetes screening involves taking a blood sample to check your fasting blood sugar level. This should be done once every 3 years after age 46 if you are at a normal weight and without risk factors for diabetes. Testing should be considered at a younger age or be carried out more frequently if you are overweight and have at least 1 risk factor for diabetes.  Colorectal cancer can be detected and often prevented. Most routine colorectal cancer screening begins at the age of 46 and continues through age 66. However, your health care provider may recommend screening at an earlier age if you have risk factors for colon cancer. On a yearly basis, your health care provider may provide home test kits to check for hidden blood in the stool. A small camera at the end of a tube may be used to directly examine the colon (sigmoidoscopy or colonoscopy) to detect the earliest forms of colorectal cancer. Talk to your health care provider about this at age 2 when routine screening begins. A direct exam of the colon should be repeated every 5-10 years through age 26, unless early forms of precancerous polyps or small growths are found.  People who are at an increased risk for hepatitis B should be screened for this virus. You are considered at high risk for hepatitis B if:  You were born in a country where hepatitis B occurs often. Talk with your health care provider about which countries are considered high risk.  Your parents were born in a high-risk country and you have not received a shot to protect against hepatitis B (hepatitis B vaccine).  You have HIV or  AIDS.  You use needles to inject street drugs.  You live with, or have sex with, someone who has hepatitis B.  You are a man who has sex with other men (MSM).  You get hemodialysis treatment.  You take certain medicines for conditions like cancer, organ transplantation, and autoimmune conditions.  Hepatitis C blood testing is recommended for all people born from [birth year] through 1964 and any individual with known risk factors for hepatitis C.  Healthy men should no longer receive prostate-specific antigen (PSA) blood tests as part of routine cancer screening. Talk to your health care provider about prostate cancer screening.  Testicular cancer screening is not recommended for adolescents or adult males who have no symptoms. Screening includes self-exam, a health care provider exam, and other screening tests. Consult with your health care provider about any symptoms you have or any  concerns you have about testicular cancer.  Practice safe sex. Use condoms and avoid high-risk sexual practices to reduce the spread of sexually transmitted infections (STIs).  You should be screened for STIs, including gonorrhea and chlamydia if:  You are sexually active and are younger than 24 years.  You are older than 24 years, and your health care provider tells you that you are at risk for this type of infection.  Your sexual activity has changed since you were last screened, and you are at an increased risk for chlamydia or gonorrhea. Ask your health care provider if you are at risk.  If you are at risk of being infected with HIV, it is recommended that you take a prescription medicine daily to prevent HIV infection. This is called pre-exposure prophylaxis (PrEP). You are considered at risk if:  You are a man who has sex with other men (MSM).  You are a heterosexual man who is sexually active with multiple partners.  You take drugs by injection.  You are sexually active with a partner who has  HIV.  Talk with your health care provider about whether you are at high risk of being infected with HIV. If you choose to begin PrEP, you should first be tested for HIV. You should then be tested every 3 months for as long as you are taking PrEP.  Use sunscreen. Apply sunscreen liberally and repeatedly throughout the day. You should seek shade when your shadow is shorter than you. Protect yourself by wearing long sleeves, pants, a wide-brimmed hat, and sunglasses year round whenever you are outdoors.  Tell your health care provider of new moles or changes in moles, especially if there is a change in shape or color. Also, tell your health care provider if a mole is larger than the size of a pencil eraser.  A one-time screening for abdominal aortic aneurysm (AAA) and surgical repair of large AAAs by ultrasound is recommended for men aged 15-75 years who are current or former smokers.  Stay current with your vaccines (immunizations).   This information is not intended to replace advice given to you by your health care provider. Make sure you discuss any questions you have with your health care provider.   Document Released: 05/21/2008 Document Revised: 12/14/2014 Document Reviewed: 04/20/2011 Elsevier Interactive Patient Education [employer].

## 2016-08-21 ENCOUNTER — Encounter: Payer: Self-pay | Admitting: Family Medicine

## 2016-08-21 DIAGNOSIS — E559 Vitamin D deficiency, unspecified: Secondary | ICD-10-CM | POA: Insufficient documentation

## 2016-08-21 NOTE — Assessment & Plan Note (Signed)
Followed closely by [facility] cardiologist and surgeon. On cellcept and prograf.

## 2016-08-21 NOTE — Assessment & Plan Note (Signed)
Add 1000 IU vit D to daily regimen.

## 2016-08-21 NOTE — Assessment & Plan Note (Signed)
Advanced planning: has living will at home. HCPOA is sister Frederick Stephens.  Will bring me copy 

## 2016-08-21 NOTE — Assessment & Plan Note (Signed)
Preventative protocols reviewed and updated unless pt declined. Discussed healthy diet and lifestyle.  

## 2016-08-21 NOTE — Assessment & Plan Note (Signed)
L irrigation successfully performed.

## 2016-08-21 NOTE — Assessment & Plan Note (Signed)
Chronic, stable. Continue current regimen. 

## 2016-08-21 NOTE — Assessment & Plan Note (Signed)
Continue magnesium TID.

## 2016-08-21 NOTE — Assessment & Plan Note (Signed)
Stable period, seems euvolemic.  

## 2016-08-21 NOTE — Assessment & Plan Note (Signed)
Continue fosamax. Unsure latest DEXA.

## 2016-08-21 NOTE — Assessment & Plan Note (Signed)
PSA reassuring today. DRE not performed.

## 2016-08-21 NOTE — Assessment & Plan Note (Signed)

## 2016-10-20 ENCOUNTER — Other Ambulatory Visit: Payer: Self-pay

## 2016-10-20 MED ORDER — AMOXICILLIN 500 MG PO CAPS: 2000.0000 mg | ORAL_CAPSULE | Freq: Every day | ORAL | 0 refills | Status: DC | PRN

## 2016-10-20 NOTE — Telephone Encounter (Signed)
Pt left v/m; pt is heart transplant pt and has dental appt on 10/21/16 at 12noon and needs refill amoxicillin 500 mg to [facility]. Pt request cb when med sent. Pt annual exam on 08/20/16.

## 2016-11-19 ENCOUNTER — Encounter: Payer: Self-pay | Admitting: Family Medicine

## 2016-12-01 ENCOUNTER — Encounter: Payer: Self-pay | Admitting: Family Medicine

## 2017-05-27 ENCOUNTER — Other Ambulatory Visit: Payer: Self-pay | Admitting: Family Medicine

## 2017-06-07 ENCOUNTER — Ambulatory Visit (INDEPENDENT_AMBULATORY_CARE_PROVIDER_SITE_OTHER): Payer: Medicare Other | Admitting: Family Medicine

## 2017-06-07 ENCOUNTER — Encounter: Payer: Self-pay | Admitting: Family Medicine

## 2017-06-07 VITALS — BP 108/70 | HR 78 | Temp 98.4°F | Wt 184.0 lb

## 2017-06-07 DIAGNOSIS — M7989 Other specified soft tissue disorders: Secondary | ICD-10-CM | POA: Insufficient documentation

## 2017-06-07 LAB — CBC WITH DIFFERENTIAL/PLATELET
BASOS PCT: 0.6 % (ref 0.0–3.0)
Basophils Absolute: 0 10*3/uL (ref 0.0–0.1)
EOS ABS: 0.1 10*3/uL (ref 0.0–0.7)
Eosinophils Relative: 2.7 % (ref 0.0–5.0)
HCT: 44.2 % (ref 39.0–52.0)
Hemoglobin: 14.6 g/dL (ref 13.0–17.0)
Lymphocytes Relative: 38 % (ref 12.0–46.0)
Lymphs Abs: 1.5 10*3/uL (ref 0.7–4.0)
MCHC: 33.1 g/dL (ref 30.0–36.0)
MCV: 92.3 fl (ref 78.0–100.0)
MONO ABS: 0.4 10*3/uL (ref 0.1–1.0)
Monocytes Relative: 9.1 % (ref 3.0–12.0)
NEUTROS ABS: 2 10*3/uL (ref 1.4–7.7)
Neutrophils Relative %: 49.6 % (ref 43.0–77.0)
PLATELETS: 220 10*3/uL (ref 150.0–400.0)
RBC: 4.79 Mil/uL (ref 4.22–5.81)
RDW: 13 % (ref 11.5–15.5)
WBC: 4 10*3/uL (ref 4.0–10.5)

## 2017-06-07 LAB — BASIC METABOLIC PANEL
BUN: 15 mg/dL (ref 6–23)
CHLORIDE: 107 meq/L (ref 96–112)
CO2: 27 meq/L (ref 19–32)
Calcium: 10.2 mg/dL (ref 8.4–10.5)
Creatinine, Ser: 1.07 mg/dL (ref 0.40–1.50)
GFR: 87.99 mL/min (ref >60.00)
Glucose, Bld: 137 mg/dL — ABNORMAL HIGH (ref 70–99)
Potassium: 4.2 mEq/L (ref 3.5–5.1)
Sodium: 142 mEq/L (ref 135–145)

## 2017-06-07 LAB — URIC ACID: URIC ACID, SERUM: 8.2 mg/dL — AB (ref 4.0–7.8)

## 2017-06-07 LAB — SEDIMENTATION RATE: SED RATE: 25 mm/h — AB (ref 0–20)

## 2017-06-07 MED ORDER — PREDNISONE 20 MG PO TABS: ORAL_TABLET | ORAL | 0 refills | Status: DC

## 2017-06-07 MED ORDER — HYDROCODONE-ACETAMINOPHEN 5-325 MG PO TABS
1.0000 | ORAL_TABLET | Freq: Four times a day (QID) | ORAL | 0 refills | Status: DC | PRN
Start: 2017-06-07 — End: 2017-10-07

## 2017-06-07 NOTE — Progress Notes (Signed)
BP 108/70 (BP Location: Left Arm, Patient Position: Sitting, Cuff Size: Normal)   Pulse 78   Temp 98.4 F (36.9 C) (Oral)   Wt 184 lb (83.5 kg)   SpO2 95%   BMI 26.98 kg/m    CC: R index finger swelling Subjective:    Patient ID: Frederick Stephens, male    DOB: 01/25/1946, 71 y.o.   MRN: [id number]  HPI: Frederick Stephens is a 71 y.o. male presenting on 06/07/2017 for Hand Pain (Right Index Finger swollen for 2 weeks and very painful. Has h/o Gout. Taking colchicine without relief.)   Pleasant patient of mine s/ heart transplant 2009, on chronic prograf and cellcept presents with 2 wk h/o R index finger pain and swelling predominantly at MCP and PIP. No improvement despite ibuprofen and colchicine (for past 1.5 wks). R 2nd finger staying stiff for last several years. Denies redness or warmth of finger.   No other joints affected. No skin rashes, no h/o psoriasis. No recent viral URI. No fevers/chills, abd pain, nausea.   Denies inciting trauma/injury.  Unable to play golf 2/2 pain Known history of gout.   Relevant past medical, surgical, family and social history reviewed and updated as indicated. Interim medical history since our last visit reviewed. Allergies and medications reviewed and updated. Outpatient Medications Prior to Visit  Medication Sig Dispense Refill  . alendronate (FOSAMAX) 70 MG tablet Take 70 mg by mouth every Friday. Take with a full glass of water on an empty stomach.     Marland Kitchen amLODipine (NORVASC) 2.5 MG tablet Take 2.5 mg by mouth 2 (two) times daily.    Marland Kitchen amoxicillin (AMOXIL) 500 MG capsule Take 4 capsules (2,000 mg total) by mouth daily as needed (for dental procedures). Reported on 04/14/2016 4 capsule 0  . aspirin 81 MG tablet Take 81 mg by mouth daily.    . Calcium Carb-Cholecalciferol (CALCIUM-VITAMIN D) 600-400 MG-UNIT TABS Take 1 tablet by mouth daily.    . cholecalciferol (VITAMIN D) 1000 units tablet Take 1,000 Units by mouth daily.    Marland Kitchen COLCRYS 0.6 MG  tablet TAKE 1 TABLET (0.6 MG TOTAL) BY MOUTH DAILY AS NEEDED (GOUT FLARE). 30 tablet 0  . hydrocortisone (ANUSOL-HC) 2.5 % rectal cream Place 1 application rectally daily as needed.    Marland Kitchen ibuprofen (ADVIL,MOTRIN) 600 MG tablet Take 1 tablet (600 mg total) by mouth every 6 (six) hours as needed. 30 tablet 0  . Magnesium 400 MG TABS Take 1 tablet by mouth 3 (three) times daily.    . mycophenolate (CELLCEPT) 250 MG capsule Take 500 mg by mouth 2 (two) times daily.    . ondansetron (ZOFRAN) 4 MG tablet Take 1 tablet (4 mg total) by mouth every 6 (six) hours. 12 tablet 0  . pravastatin (PRAVACHOL) 20 MG tablet Take 20 mg by mouth every evening.    . propranolol (INDERAL) 80 MG tablet Take 80 mg by mouth daily.    . tacrolimus (PROGRAF) 0.5 MG capsule Take 0.5 mg by mouth 2 (two) times daily.    Marland Kitchen HYDROcodone-acetaminophen (NORCO/VICODIN) 5-325 MG tablet Take 1 tablet by mouth every 6 (six) hours as needed for moderate pain. 20 tablet 0   No facility-administered medications prior to visit.      Per HPI unless specifically indicated in ROS section below Review of Systems     Objective:    BP 108/70 (BP Location: Left Arm, Patient Position: Sitting, Cuff Size: Normal)   Pulse 78   Temp 98.4  F (36.9 C) (Oral)   Wt 184 lb (83.5 kg)   SpO2 95%   BMI 26.98 kg/m   Wt Readings from Last 3 Encounters:  06/07/17 184 lb (83.5 kg)  08/20/16 178 lb 4 oz (80.9 kg)  07/07/16 175 lb 8 oz (79.6 kg)    Physical Exam  Constitutional: He appears well-developed and well-nourished. No distress.  Musculoskeletal: He exhibits edema.  Swelling present at R index digit without erythema or warmth. Tender to palpation IP joints, no pain at MCP. Limited ROM 2/2 swelling/pain at IP joints. No pain with axial loading. No break in skin.   Skin: Skin is warm and dry. No rash noted. No erythema.  Nursing note and vitals reviewed.     Assessment & Plan:   Problem List Items Addressed This Visit    Swelling of  right index finger - Primary    Anticipate gout flare in h/o same. Check labs today - including urate, ESR, CBC, RF r/o other causes of inflammatory arthritis. Treat with prednisone course (no improvement with NSAID or colchicine so far). Discussed treatment strategies. Update if not improving with treatment. Discussed possible need for daily urate lowering therapy.  Hydrocodone for breakthrough pain - NCCSRS reviewed and appropriate      Relevant Orders   Basic metabolic panel   CBC with Differential/Platelet   Sedimentation rate   Uric acid   Rheumatoid factor       Follow up plan: Return if symptoms worsen or fail to improve.  Ria Bush, MD

## 2017-06-07 NOTE — Assessment & Plan Note (Signed)
Anticipate gout flare in h/o same. Check labs today - including urate, ESR, CBC, RF r/o other causes of inflammatory arthritis. Treat with prednisone course (no improvement with NSAID or colchicine so far). Discussed treatment strategies. Update if not improving with treatment. Discussed possible need for daily urate lowering therapy.  Hydrocodone for breakthrough pain - NCCSRS reviewed and appropriate

## 2017-06-07 NOTE — Patient Instructions (Addendum)
Labs today. This may be gout or other inflammatory arthritis.  Let's treat with steroid course sent to pharmacy.  May use hydrocodone for breakthrough pain.

## 2017-06-08 LAB — RHEUMATOID FACTOR

## 2017-06-24 ENCOUNTER — Encounter: Payer: Self-pay | Admitting: Family Medicine

## 2017-06-24 ENCOUNTER — Ambulatory Visit (INDEPENDENT_AMBULATORY_CARE_PROVIDER_SITE_OTHER): Payer: Medicare Other | Admitting: Family Medicine

## 2017-06-24 ENCOUNTER — Other Ambulatory Visit: Payer: Self-pay | Admitting: Family Medicine

## 2017-06-24 VITALS — BP 100/86 | HR 56 | Temp 98.3°F | Ht 69.25 in | Wt 183.8 lb

## 2017-06-24 DIAGNOSIS — M1000 Idiopathic gout, unspecified site: Secondary | ICD-10-CM

## 2017-06-24 DIAGNOSIS — M7989 Other specified soft tissue disorders: Secondary | ICD-10-CM | POA: Diagnosis not present

## 2017-06-24 DIAGNOSIS — Z941 Heart transplant status: Secondary | ICD-10-CM | POA: Diagnosis not present

## 2017-06-24 MED ORDER — PREDNISONE 20 MG PO TABS: ORAL_TABLET | ORAL | 0 refills | Status: DC

## 2017-06-24 MED ORDER — COLCRYS 0.6 MG PO TABS: 0.6000 mg | ORAL_TABLET | Freq: Two times a day (BID) | ORAL | 2 refills | Status: DC

## 2017-06-24 NOTE — Progress Notes (Signed)
Dr. [name], MD, [facility] [place], [address] Phone: [phone] Fax: [phone]  06/24/2017  Patient: Frederick Stephens, MRN: [id number], DOB: [birth year], 71 y.o.  Primary Physician:  Ria Bush, MD   Chief Complaint  Patient presents with  . Swollen Finger    Right Pointer x 1 1/2 months   Subjective:   Frederick Stephens is a 7 y.o. very pleasant male patient who presents with the following:  71 yo with h/o heart transplant on Prograf and Cellcept and history of longstanding gout. He was seen slightly over 2 weeks ago and given some oral prednisone. Prior to this the patient had taken some ibuprofen and colchicine. He was taking prednisone alone, and he did have some decrease in the swelling in the second digit.  Currently the swelling is quite profound and he is minimally able to bend the PIP joint.  Lab Results  Component Value Date   LABURIC 8.2 (H) 06/07/2017    Lab Results  Component Value Date   ESRSEDRATE 25 (H) 06/07/2017     R index finger swollen and painful x 1 month.  Not taking colcrys  Past Medical History, Surgical History, Social History, Family History, Problem List, Medications, and Allergies have been reviewed and updated if relevant.  Patient Active Problem List   Diagnosis Date Noted  . Swelling of right index finger 06/07/2017  . Vitamin D deficiency 08/21/2016  . Right nephrolithiasis 07/07/2016  . BPH (benign prostatic hyperplasia) 06/21/2016  . Anal itching 08/30/2015  . Hypomagnesemia 06/11/2015  . Osteoporosis   . OSA on CPAP   . Medicare annual wellness visit, subsequent 01/15/2015  . Advanced care planning/counseling discussion 01/15/2015  . Health maintenance examination 01/15/2015  . Hearing loss due to cerumen impaction 01/15/2015  . Essential tremor 10/19/2013  . HTN (hypertension)   . Gout   . CHF (congestive heart failure) ([place])   . H/O heart  transplant ([place]) 11/29/2008    Past Medical History:  Diagnosis Date  . CAP (community acquired pneumonia) 01/03/2014  . CHF (congestive heart failure) ([place])    ?HTN related  . 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 Elevated PSA 06/21/2016   40s - s/p benign biopsy 2017 followed closely by Dr Karsten Ro urology   . Gout   . History of chicken pox   . History of shingles 2012  . HTN (hypertension)   . OSA on CPAP   . Osteoporosis 2015   per [facility] records by [place] 2015, on fosamax  . S/P orthotopic heart transplant ([facility]) 11/29/08   due to heart failure,cardiomyopathy    Past Surgical History:  Procedure Laterality Date  . COLONOSCOPY  10/2014   [facility])  . dobutamine stress test  11/2014   normal  . HEART TRANSPLANT  11/29/08   [facility] (Dr Thalia Party IV)    Social History   Social History  . Marital status: Divorced    Spouse name: N/A  . Number of children: N/A  . Years of education: N/A   Occupational History  . Not on file.   Social History Main Topics  . Smoking status: Never Smoker  . Smokeless tobacco: Never Used  . Alcohol use No  . Drug use: No  . Sexual activity: Not on file   Other Topics Concern  . Not on file   Social History Narrative   Lives alone   Son lives nearby -  as well as extended family in [place].   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 Gout Mother   . Hypertension Mother   . Heart disease Mother   . Gout Brother   . Hypertension Brother   . Heart disease Father   . Diabetes Brother   . Cancer Neg Hx   . Stroke Neg Hx     Allergies  Allergen Reactions  . Aldactone [Spironolactone] Other (See Comments)    Cysts in breast AND ? Breathing problems  . Amiodarone Other (See Comments)    Gynocomastia AND ? Breathing problems    Medication list reviewed and updated  in full in [place].  GEN: No fevers, chills. Nontoxic. Primarily MSK c/o today. MSK: Detailed in the HPI GI: tolerating PO intake without difficulty Neuro: No numbness, parasthesias, or tingling associated. Otherwise the pertinent positives of the ROS are noted above.   Objective:   BP 100/86   Pulse (!) 56   Temp 98.3 F (36.8 C) (Oral)   Ht 5' 9.25" (1.759 m)   Wt 183 lb 12 oz (83.3 kg)   BMI 26.94 kg/m    GEN: WDWN, NAD, Non-toxic, Alert & Oriented x 3 HEENT: Atraumatic, Normocephalic.  Ears and Nose: No external deformity. EXTR: No clubbing/cyanosis/edema NEURO: Normal gait.  PSYCH: Normally interactive. Conversant. Not depressed or anxious appearing.  Calm demeanor.     Right hand: There is no significant swelling or synovitis in the MCP joints or the PIP or DIP joints of the first, third, fourth, or fifth digits. The fifth PIP joint is markedly swollen as well as the adjacent finger. There is significant loss of motion diffusely at the PIP joint. The joint is stable. There is no painful tenosynovitis.  Radiology: No results found.  Assessment and Plan:   Acute idiopathic gout, unspecified site  H/O heart transplant ([place])  Swelling of right index finger  Clinically consistent with PIP joint gout. He may have an old ligamentous injury, given his history of repetitive golf playing. He was concerned about possible trigger finger, but he does not have a trigger finger.  Recommended longer course of oral prednisone. Reinitiate oral colchicine.   If symptoms persist, prophylactic medication would not be unreasonable.  Follow-up: No Follow-up on file.  Meds ordered this encounter  Medications  . predniSONE (DELTASONE) 20 MG tablet    Sig: 2 tabs po for 6 days, then 1 tab po for 6 days    Dispense:  18 tablet    Refill:  0  . COLCRYS 0.6 MG tablet    Sig: Take 1 tablet (0.6 mg total) by mouth 2 (two) times daily. Prn gout flare    Dispense:  30 tablet     Refill:  2   Medications Discontinued During This Encounter  Medication Reason  . predniSONE (DELTASONE) 20 MG tablet Completed Course  . COLCRYS 0.6 MG tablet Reorder   No orders of the defined types were placed in this encounter.   Signed,  Maud Deed. Davi Rotan, MD   Allergies as of 06/24/2017      Reactions   Aldactone [spironolactone] Other (See Comments)   Cysts in breast AND ? Breathing problems   Amiodarone Other (See Comments)   Gynocomastia AND ? Breathing problems      Medication List  Accurate as of 06/24/17  1:28 PM. Always use your most recent med list.          alendronate 70 MG tablet Commonly known as:  FOSAMAX Take 70 mg by mouth every Friday. Take with a full glass of water on an empty stomach.   amLODipine 2.5 MG tablet Commonly known as:  NORVASC Take 2.5 mg by mouth 2 (two) times daily.   amoxicillin 500 MG capsule Commonly known as:  AMOXIL Take 4 capsules (2,000 mg total) by mouth daily as needed (for dental procedures). Reported on 04/14/2016   aspirin 81 MG tablet Take 81 mg by mouth daily.   Calcium-Vitamin D 600-400 MG-UNIT Tabs Take 1 tablet by mouth daily.   cholecalciferol 1000 units tablet Commonly known as:  VITAMIN D Take 1,000 Units by mouth daily.   COLCRYS 0.6 MG tablet Generic drug:  colchicine Take 1 tablet (0.6 mg total) by mouth 2 (two) times daily. Prn gout flare   HYDROcodone-acetaminophen 5-325 MG tablet Commonly known as:  NORCO/VICODIN Take 1 tablet by mouth every 6 (six) hours as needed for moderate pain.   hydrocortisone 2.5 % rectal cream Commonly known as:  ANUSOL-HC Place 1 application rectally daily as needed.   ibuprofen 600 MG tablet Commonly known as:  ADVIL,MOTRIN Take 1 tablet (600 mg total) by mouth every 6 (six) hours as needed.   Magnesium 400 MG Tabs Take 1 tablet by mouth 3 (three) times daily.   mycophenolate 250 MG capsule Commonly known as:  CELLCEPT Take 500 mg by mouth 2 (two)  times daily.   ondansetron 4 MG tablet Commonly known as:  ZOFRAN Take 1 tablet (4 mg total) by mouth every 6 (six) hours.   pravastatin 20 MG tablet Commonly known as:  PRAVACHOL Take 20 mg by mouth every evening.   predniSONE 20 MG tablet Commonly known as:  DELTASONE 2 tabs po for 6 days, then 1 tab po for 6 days   propranolol 80 MG tablet Commonly known as:  INDERAL Take 80 mg by mouth daily.   tacrolimus 0.5 MG capsule Commonly known as:  PROGRAF Take 0.5 mg by mouth 2 (two) times daily.

## 2017-06-24 NOTE — Patient Instructions (Signed)
Restart your colchicine, 1 tablet twice a day.  Stop it 3 days after you get better.

## 2017-09-14 IMAGING — CT CT ABD-PELV W/ CM
2 of 5 series · 15 of 46 positions shown, 17 images · IV contrast (Omni 300)
Comparison: None.

CLINICAL DATA: Epigastric pain

EXAM:
CT ABDOMEN AND PELVIS WITH CONTRAST
TECHNIQUE: Multidetector CT imaging of the abdomen and pelvis was performed
using the standard protocol following bolus administration of
intravenous contrast.
CONTRAST:  100mL ONGO0Y-HMM IOPAMIDOL (ONGO0Y-HMM) INJECTION 61%

[Series 2: a/p w/ 5mm · axial · 0.63mm/px · z∈[-1009,-544]mm · 12 of 105 slices shown, 14 images]
[im 6/105  soft-tissue]
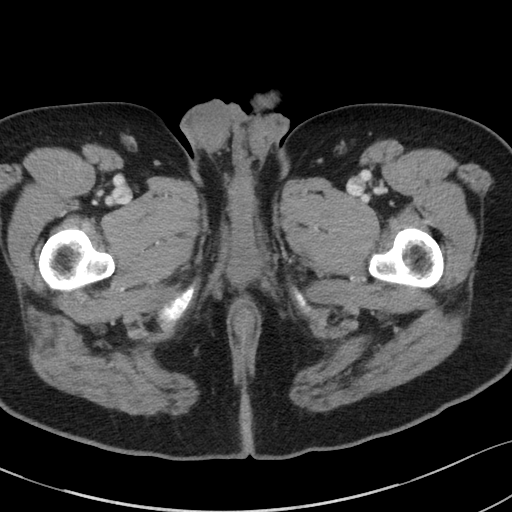
[im 6/105  bone]
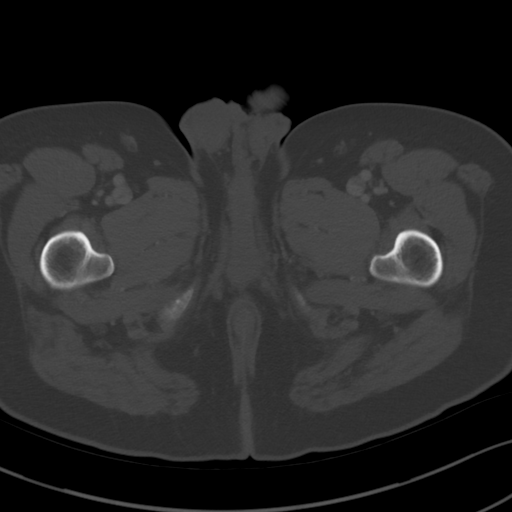
[im 17/105  soft-tissue]
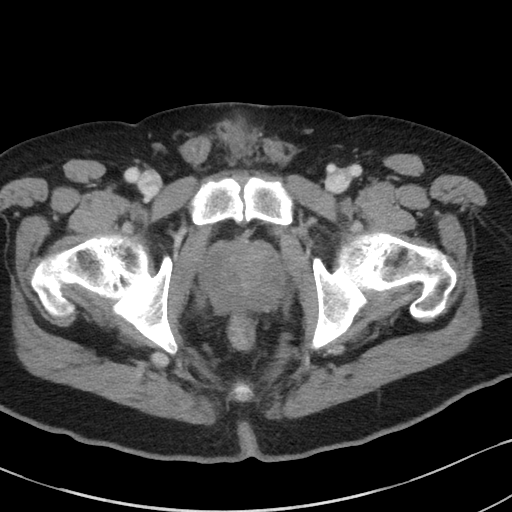
[im 22/105  soft-tissue]
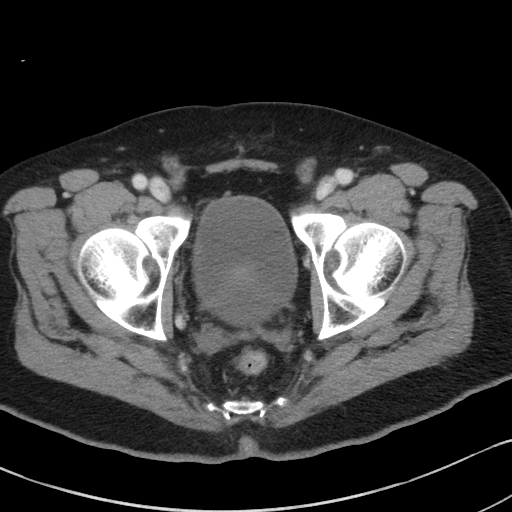
[im 33/105  soft-tissue]
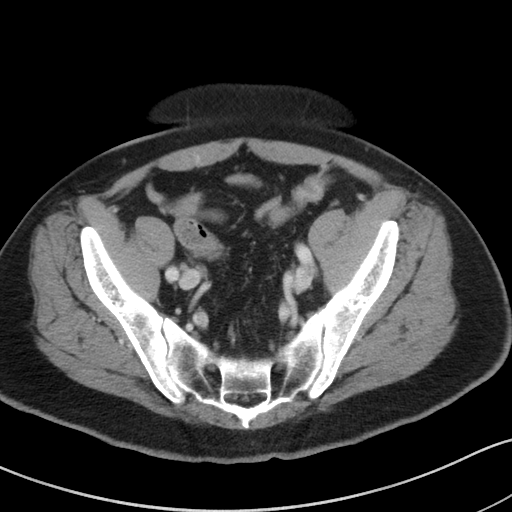
[im 39/105  soft-tissue]
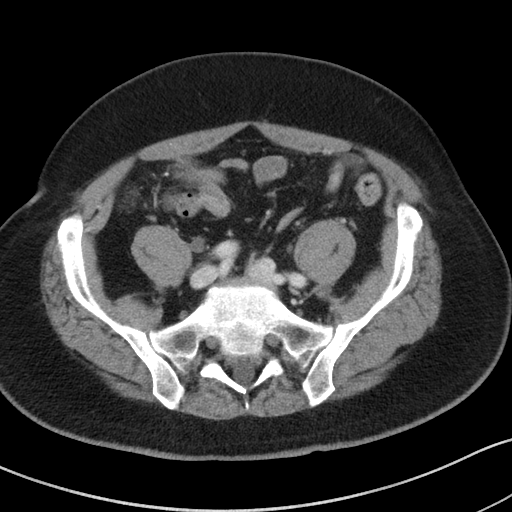
[im 50/105  soft-tissue]
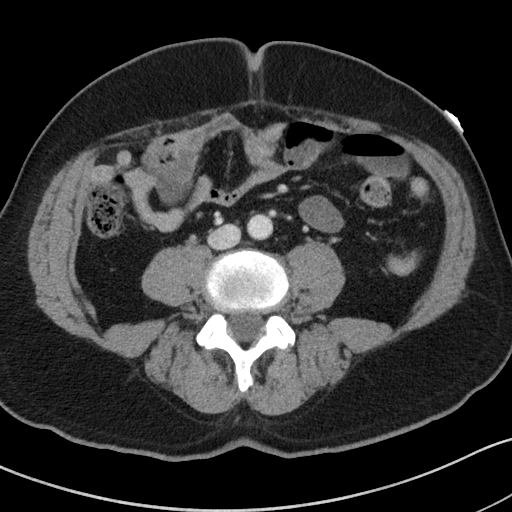
[im 55/105  soft-tissue]
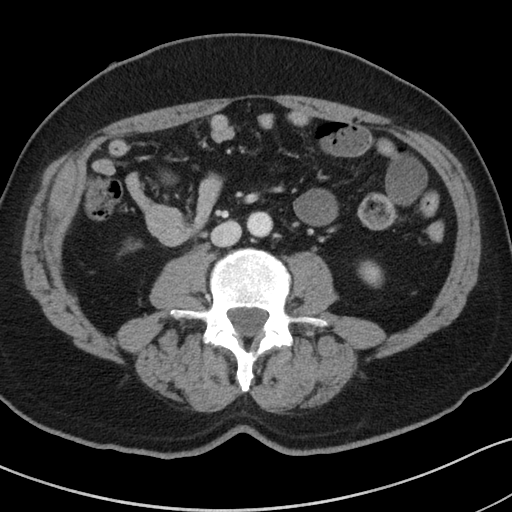
[im 66/105  soft-tissue]
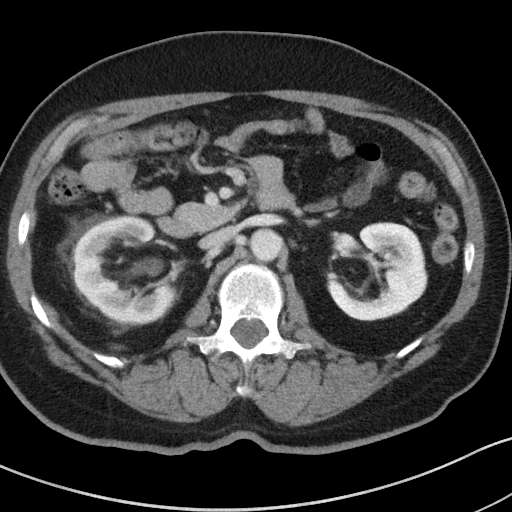
[im 72/105  soft-tissue]
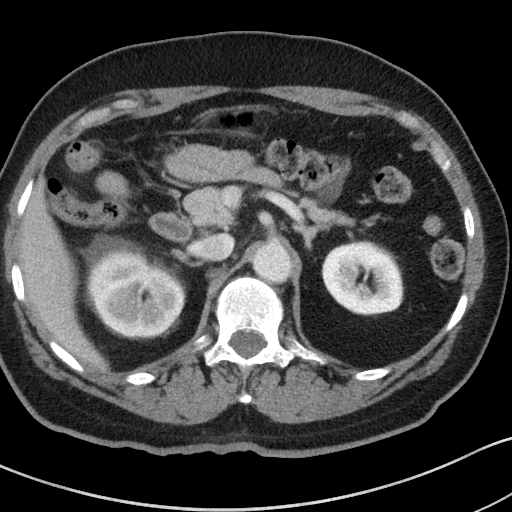
[im 72/105  bone]
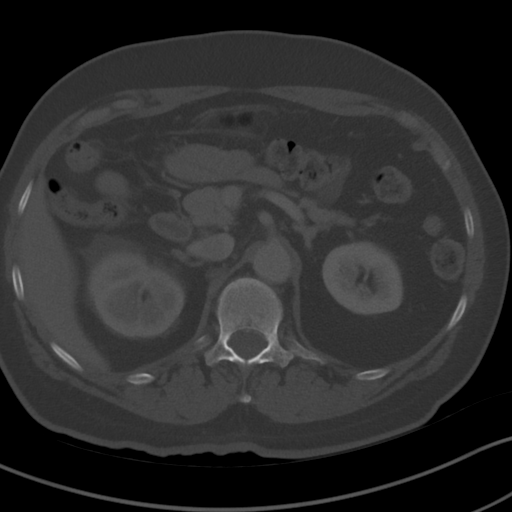
[im 83/105  soft-tissue]
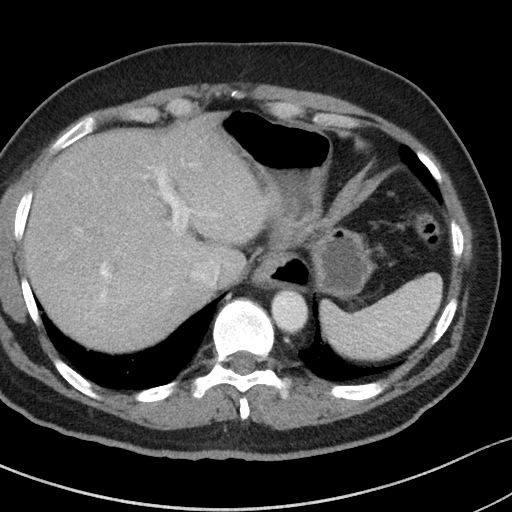
[im 88/105  soft-tissue]
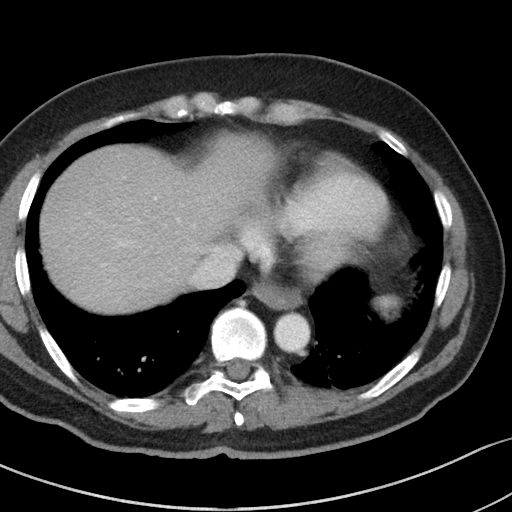
[im 99/105  soft-tissue]
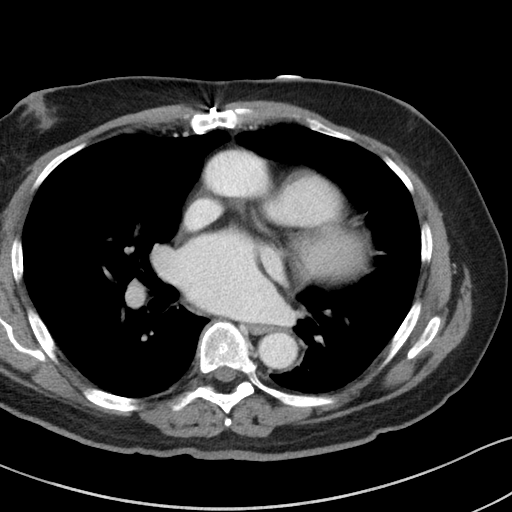

[Series 5: a/p w/ cor · coronal · 0.63mm/px · 3 of 114 slices shown]
[im 38/114  soft-tissue]
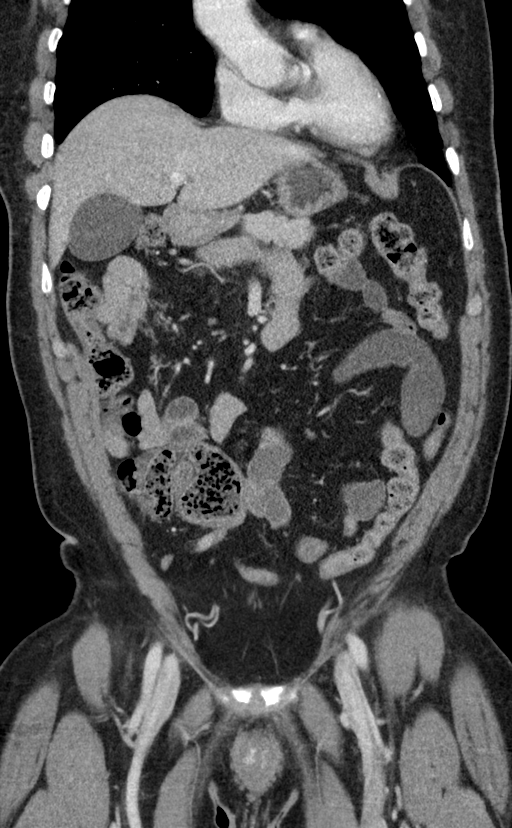
[im 51/114  soft-tissue]
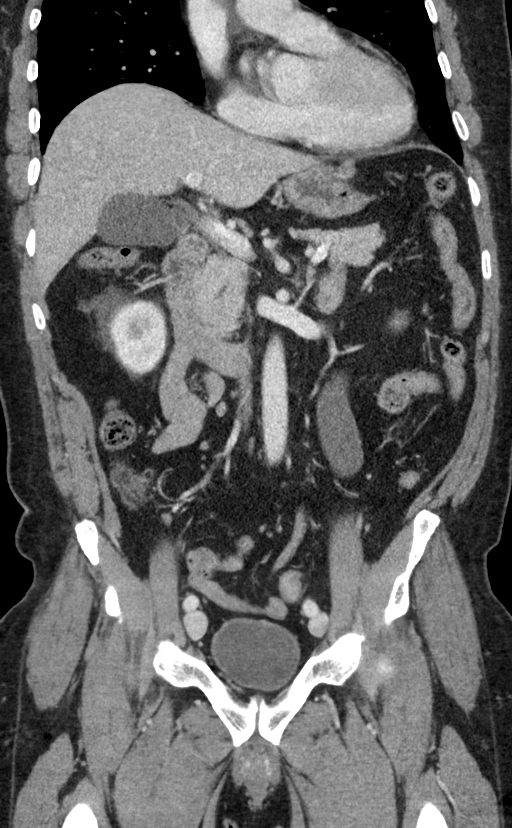
[im 63/114  soft-tissue]
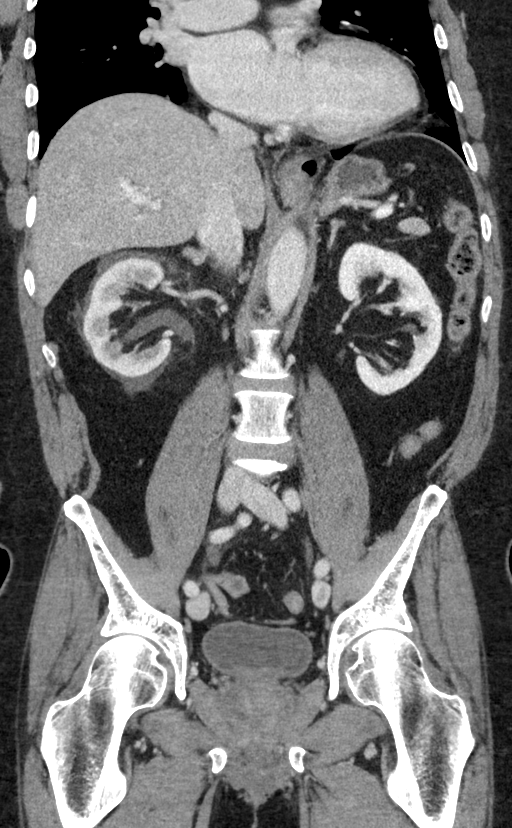

[15 of 46 positions shown; findings below may reference images not displayed]

FINDINGS: Lower chest:  No acute findings.

Hepatobiliary: Mild fatty infiltration of the liver is noted. A few
scattered small cysts are seen.

Pancreas: No mass, inflammatory changes, or other significant
abnormality.

Spleen: Within normal limits in size and appearance.

Adrenals/Urinary Tract: The adrenal glands are within normal limits.
The left kidney is within normal limits with the exception of renal
cystic change. The right kidney demonstrates mild hydronephrosis and
perinephric stranding secondary to a tiny 2 mm stone in the distal
right ureter best seen on image number 83 of series 2. The bladder
is well distended.

Stomach/Bowel: No evidence of obstruction, inflammatory process, or
abnormal fluid collections. The appendix is within normal limits.

Vascular/Lymphatic: No pathologically enlarged lymph nodes. No
evidence of abdominal aortic aneurysm.

Reproductive: No mass or other significant abnormality.

Other: None.

Musculoskeletal:  No suspicious bone lesions identified.
IMPRESSION: Distal right ureteral stone with mild obstructive changes.

Chronic changes as described above.

## 2017-09-14 IMAGING — CR DG CHEST 2V
2 series · 2 of 2 positions shown · non-contrast
Comparison: January 03, 2014

CLINICAL DATA: Right lower quadrant pain.

EXAM:
CHEST  2 VIEW

[chest pa]
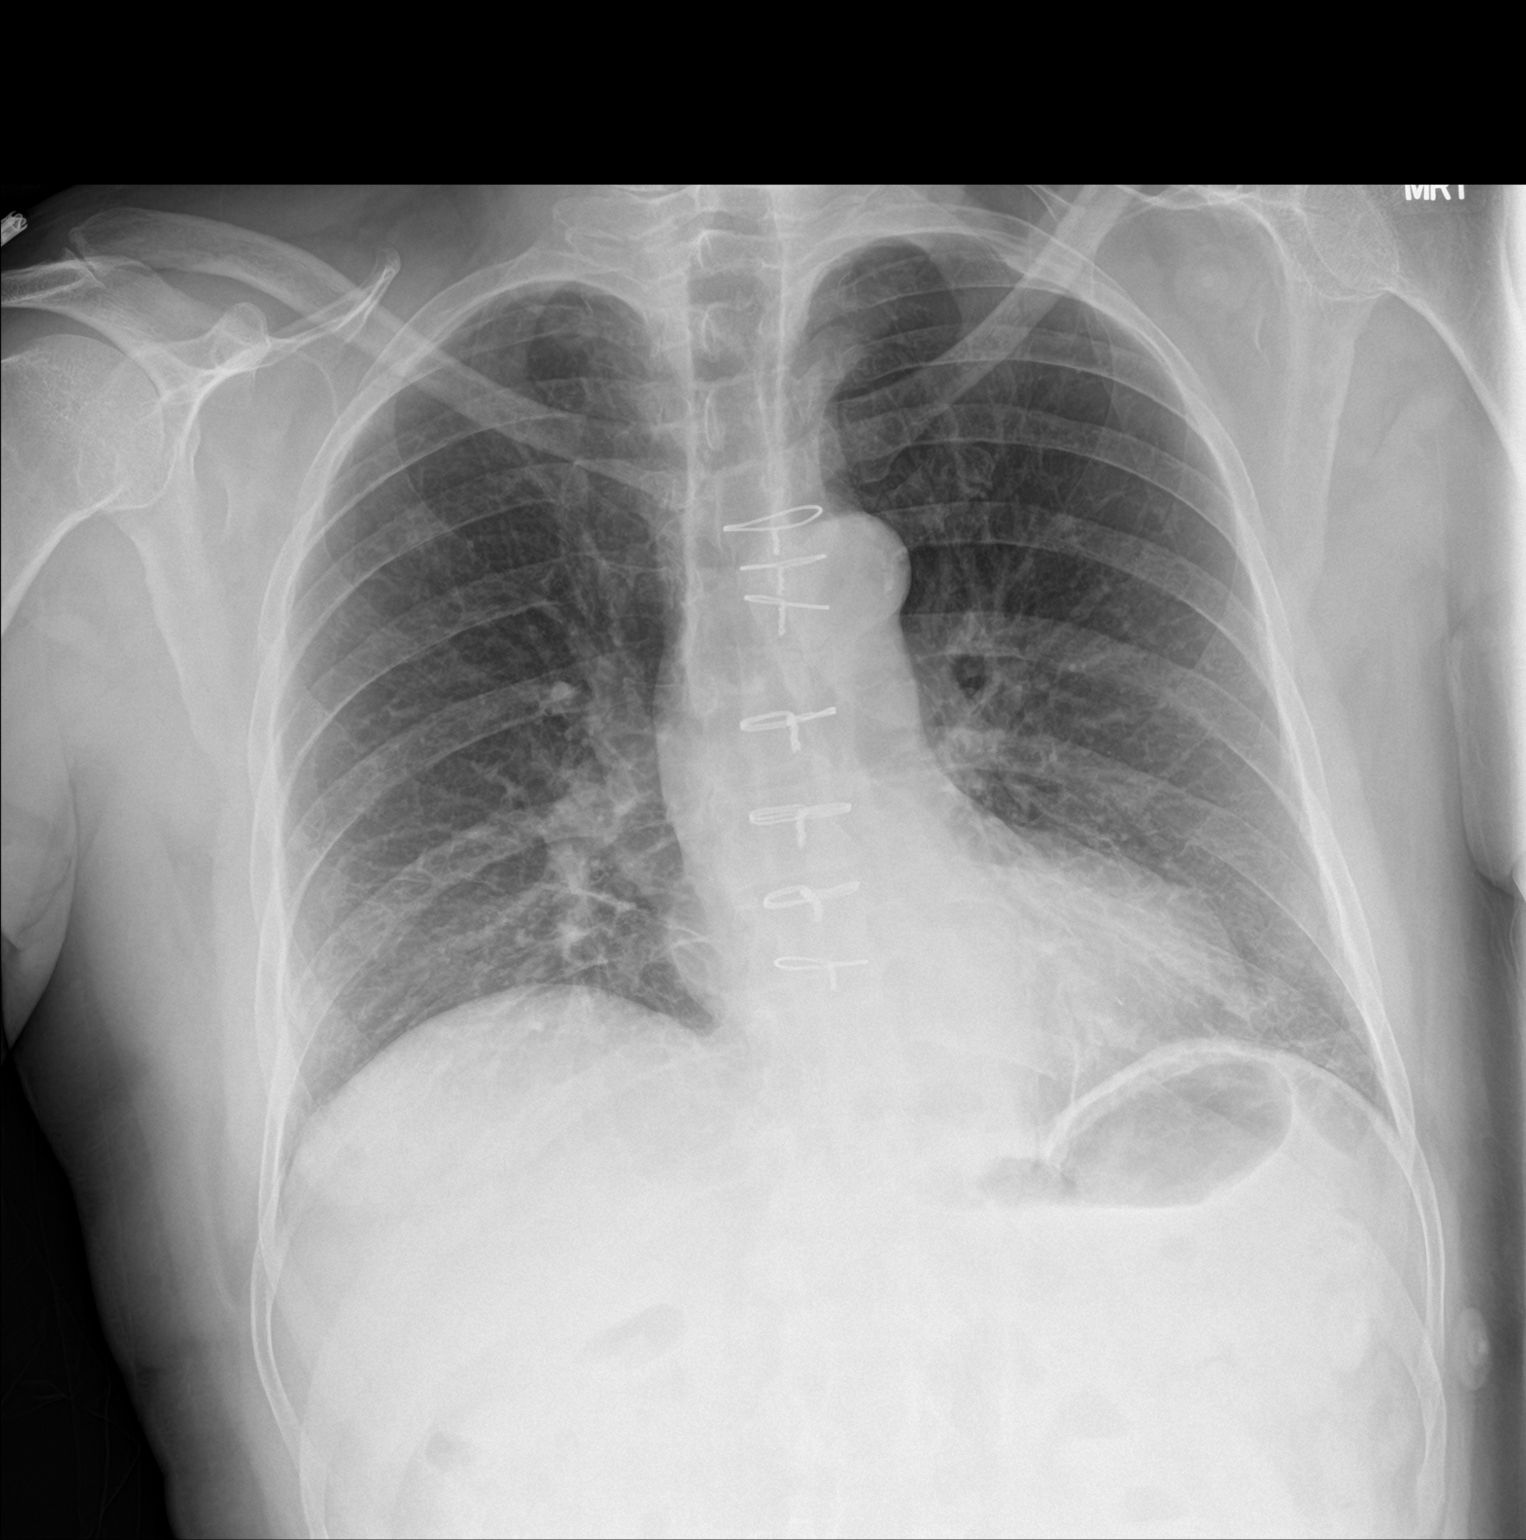

[chest lat]
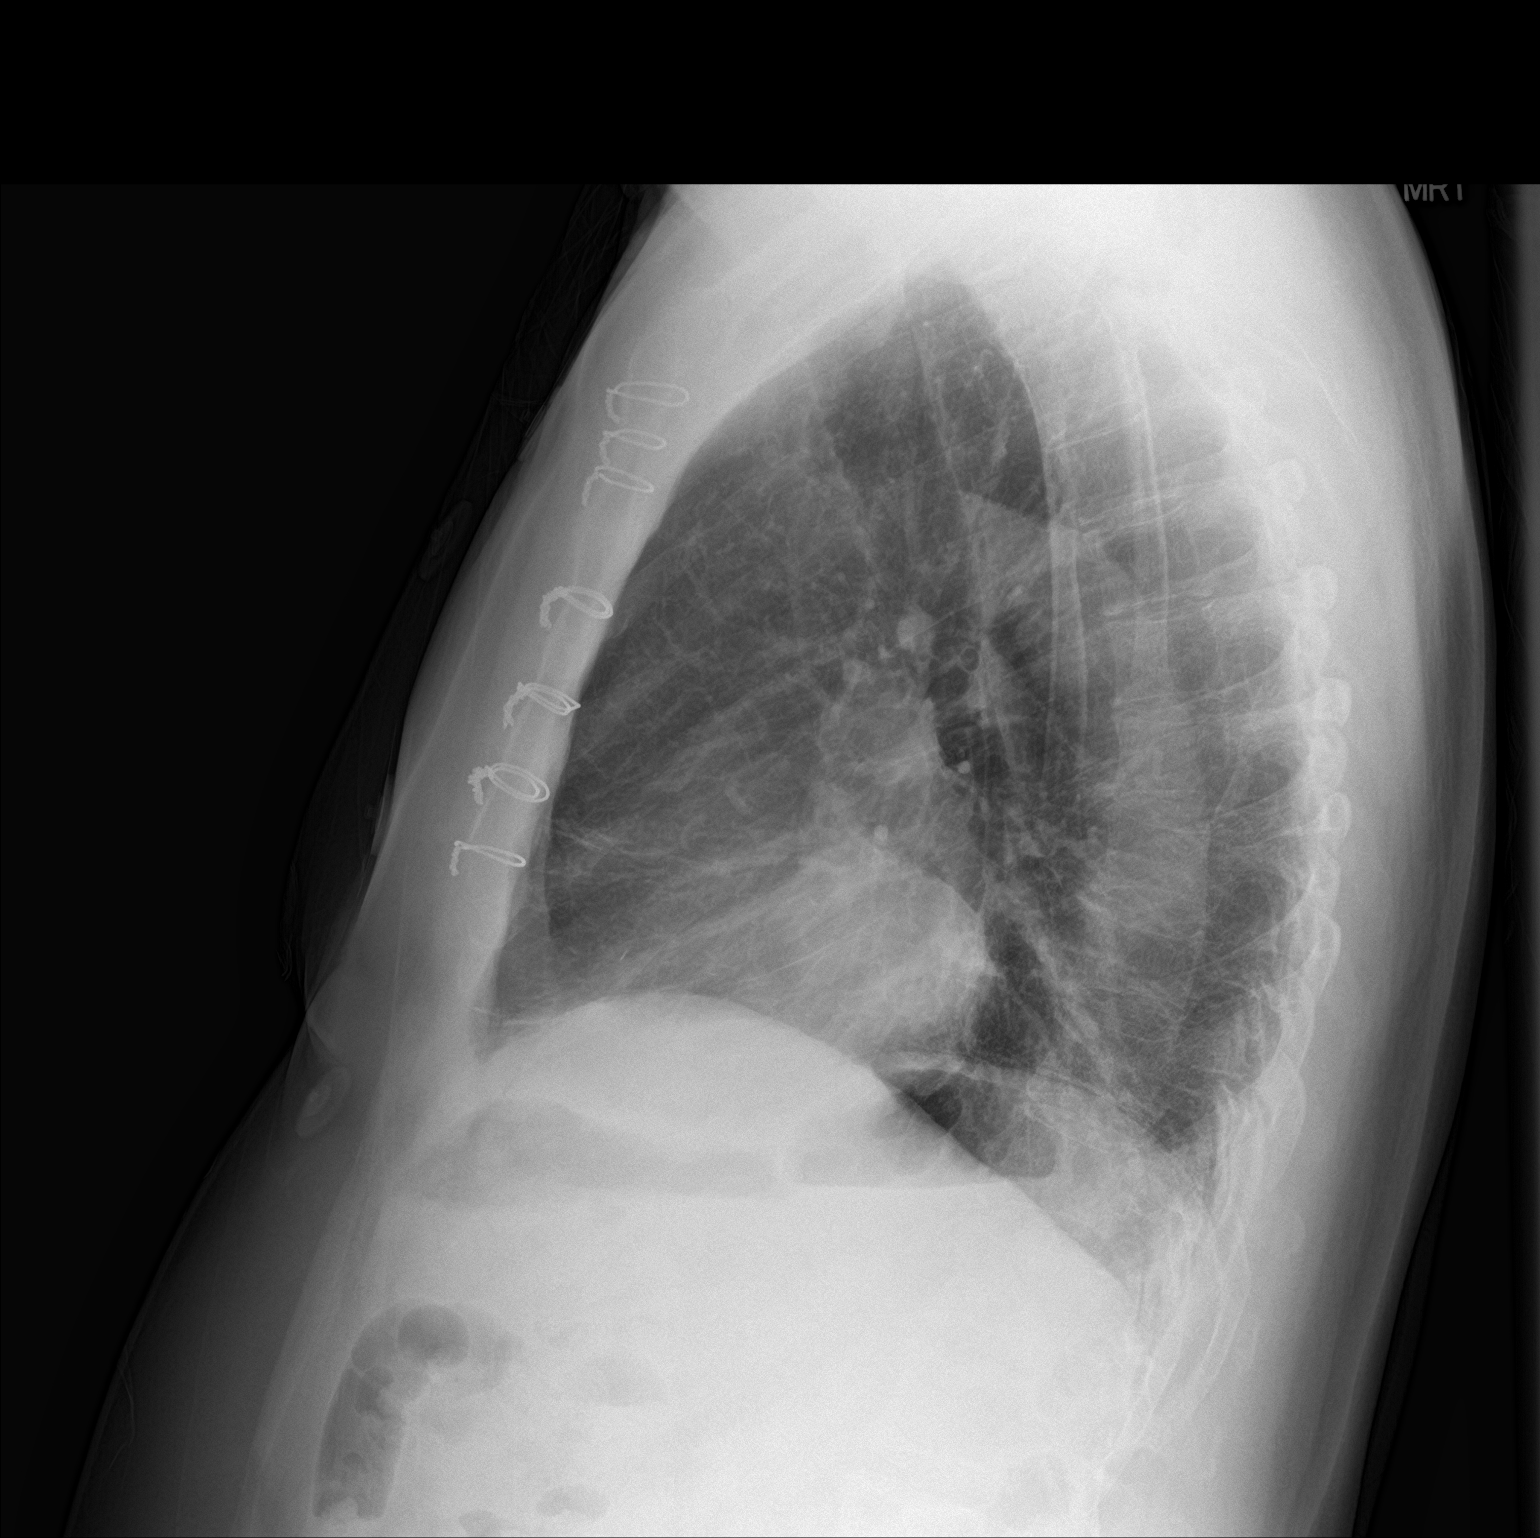

[2 of 2 positions shown; findings below may reference images not displayed]

FINDINGS: Minimal opacity in left lung base suggests atelectasis. No pulmonary
nodules or masses. No focal infiltrates. The heart, hila, and
mediastinum are normal.
IMPRESSION: No active cardiopulmonary disease.

## 2017-10-07 ENCOUNTER — Telehealth: Payer: Self-pay | Admitting: Family Medicine

## 2017-10-07 MED ORDER — HYDROCODONE-ACETAMINOPHEN 5-325 MG PO TABS: 1.0000 | ORAL_TABLET | Freq: Four times a day (QID) | ORAL | 0 refills | Status: DC | PRN

## 2017-10-07 NOTE — Telephone Encounter (Signed)
Last Rx and OV 06/2017 #15.

## 2017-10-07 NOTE — Telephone Encounter (Signed)
Copied from [place] [id number]. Topic: Quick Communication - See Telephone Encounter >> Oct 07, 2017 10:40 AM Patrice Paradise wrote: CRM for notification. See Telephone encounter for: 10/07/17.  Patient called and stated that he needed his Hydrocodone-acetaminophen 5-325 mg refilled. Pt [facility] but, was told he had to call his doctor.

## 2017-10-07 NOTE — Telephone Encounter (Signed)
Spoke with pt notifying him rx is ready to pick up. [Placed rx at front office.]

## 2017-10-07 NOTE — Telephone Encounter (Signed)
Printed and in CMA box. [place] CSRS reviewed.  

## 2017-11-02 ENCOUNTER — Other Ambulatory Visit: Payer: Self-pay | Admitting: Family Medicine

## 2017-11-02 MED ORDER — AMOXICILLIN 500 MG PO CAPS: 2000.0000 mg | ORAL_CAPSULE | Freq: Every day | ORAL | 3 refills | Status: DC | PRN

## 2017-11-02 NOTE — Telephone Encounter (Signed)
plz notify this was sent in. 

## 2017-11-02 NOTE — Telephone Encounter (Signed)
Patient calling back requesting this medication refill before 12 today.

## 2017-11-02 NOTE — Telephone Encounter (Signed)
Copied from Page. Topic: Quick Communication - See Telephone Encounter >> Nov 02, 2017  9:19 AM Bea Graff, NT wrote: CRM for notification. See Telephone encounter for: [facility] pt to have a refill of amoxicillin. He has a dental appt today at 2pm in [facility] and needs this medication before he goes to this appt. He has to leave this area by 12pm today. [facility] [address] Please call pt once sent to pharmacy.  11/02/17.

## 2017-11-02 NOTE — Telephone Encounter (Signed)
Pt notified as instructed and pt voiced understanding. 

## 2017-11-02 NOTE — Telephone Encounter (Signed)
See refill request note on 11/02/17 for abx.

## 2017-11-02 NOTE — Telephone Encounter (Signed)
Last filled:  10/21/16, #4 Last OV:  06/07/17 Next OV:  11/05/17

## 2017-11-02 NOTE — Telephone Encounter (Signed)
Spoke with pt notifying him rx was sent to pharmacy per Dr. Darnell Level.

## 2017-11-05 ENCOUNTER — Encounter: Payer: Self-pay | Admitting: Family Medicine

## 2017-11-05 ENCOUNTER — Ambulatory Visit (INDEPENDENT_AMBULATORY_CARE_PROVIDER_SITE_OTHER): Payer: Medicare Other | Admitting: Family Medicine

## 2017-11-05 VITALS — BP 120/82 | HR 80 | Temp 97.8°F | Wt 182.0 lb

## 2017-11-05 DIAGNOSIS — N529 Male erectile dysfunction, unspecified: Secondary | ICD-10-CM

## 2017-11-05 DIAGNOSIS — Z941 Heart transplant status: Secondary | ICD-10-CM | POA: Diagnosis not present

## 2017-11-05 MED ORDER — SILDENAFIL CITRATE 20 MG PO TABS: 40.0000 mg | ORAL_TABLET | Freq: Every day | ORAL | 0 refills | Status: DC | PRN

## 2017-11-05 NOTE — Assessment & Plan Note (Signed)
Anticipate vasculogenic erectile dysfunction. Discussed cause and treatment options. Pt interested in generic sildenafil, has investigated this in the past. Discussed side effects and adverse events to watch for including but not limited to headache, chest pain, priapism. Pt aware of need to avoid allnitrates. I asked him to check with transplant docs about PDE5 inhibitor use in setting of heart transplant. He mentions he has been prescribed this by them in the past and tolerated well.

## 2017-11-05 NOTE — Patient Instructions (Addendum)
Trial generic sildenafil 2-5 tablets as needed for relations. Start 2 tablets daily. May increase as needed to max 5 tablets at a time.  Good to see you today, call us with questions.

## 2017-11-05 NOTE — Progress Notes (Signed)
BP 120/82 (BP Location: Left Arm, Patient Position: Sitting, Cuff Size: Normal)   Pulse 80   Temp 97.8 F (36.6 C) (Oral)   Wt 182 lb (82.6 kg)   SpO2 96%   BMI 26.68 kg/m    CC: ED Subjective:    Patient ID: Frederick Stephens, male    DOB: [birth year], 71 y.o.   MRN: [id number]  HPI: Frederick Stephens is a 71 y.o. male presenting on 11/05/2017 for Erectile Dysfunction (Trouble getting erection. Just got married 10/30/17. Denies any previous issues)   Newly married over thanksgiving weekend. Monogamous relationship.  Interested in erectile dysfunction medication.  Trouble maintaining erection. Does awaken with erection. No trouble with sex drive.  Has been prescribed ED med by heart transplant docs in the past. doesn't remember effect.   Denies dysuria, urethral discharge, inguinal LAD, or testicular pain/mass.   Interested in generic sildenafil.   Relevant past medical, surgical, family and social history reviewed and updated as indicated. Interim medical history since our last visit reviewed. Allergies and medications reviewed and updated. Outpatient Medications Prior to Visit  Medication Sig Dispense Refill  . alendronate (FOSAMAX) 70 MG tablet Take 70 mg by mouth every Friday. Take with a full glass of water on an empty stomach.     Marland Kitchen amLODipine (NORVASC) 2.5 MG tablet Take 2.5 mg by mouth 2 (two) times daily.    Marland Kitchen amoxicillin (AMOXIL) 500 MG capsule Take 4 capsules (2,000 mg total) by mouth daily as needed (for dental procedures). Reported on 04/14/2016 4 capsule 3  . aspirin 81 MG tablet Take 81 mg by mouth daily.    . Calcium Carb-Cholecalciferol (CALCIUM-VITAMIN D) 600-400 MG-UNIT TABS Take 1 tablet by mouth daily.    . cholecalciferol (VITAMIN D) 1000 units tablet Take 1,000 Units by mouth daily.    Marland Kitchen COLCRYS 0.6 MG tablet Take 1 tablet (0.6 mg total) by mouth 2 (two) times daily. Prn gout flare 30 tablet 2  . HYDROcodone-acetaminophen (NORCO/VICODIN) 5-325 MG tablet Take 1  tablet by mouth every 6 (six) hours as needed for moderate pain. 15 tablet 0  . hydrocortisone (ANUSOL-HC) 2.5 % rectal cream Place 1 application rectally daily as needed.    Marland Kitchen ibuprofen (ADVIL,MOTRIN) 600 MG tablet Take 1 tablet (600 mg total) by mouth every 6 (six) hours as needed. 30 tablet 0  . Magnesium 400 MG TABS Take 1 tablet by mouth 3 (three) times daily.    . mycophenolate (CELLCEPT) 250 MG capsule Take 500 mg by mouth 2 (two) times daily.    . ondansetron (ZOFRAN) 4 MG tablet Take 1 tablet (4 mg total) by mouth every 6 (six) hours. 12 tablet 0  . pravastatin (PRAVACHOL) 20 MG tablet Take 20 mg by mouth every evening.    . predniSONE (DELTASONE) 20 MG tablet 2 tabs po for 6 days, then 1 tab po for 6 days 18 tablet 0  . propranolol (INDERAL) 80 MG tablet Take 80 mg by mouth daily.    . tacrolimus (PROGRAF) 0.5 MG capsule Take 0.5 mg by mouth 2 (two) times daily.     No facility-administered medications prior to visit.      Per HPI unless specifically indicated in ROS section below Review of Systems     Objective:    BP 120/82 (BP Location: Left Arm, Patient Position: Sitting, Cuff Size: Normal)   Pulse 80   Temp 97.8 F (36.6 C) (Oral)   Wt 182 lb (82.6 kg)   SpO2 96%  BMI 26.68 kg/m   Wt Readings from Last 3 Encounters:  11/05/17 182 lb (82.6 kg)  06/24/17 183 lb 12 oz (83.3 kg)  06/07/17 184 lb (83.5 kg)    Physical Exam  Constitutional: He appears well-developed and well-nourished. No distress.  HENT:  Mouth/Throat: Oropharynx is clear and moist. No oropharyngeal exudate.  Cardiovascular: Normal rate, regular rhythm, normal heart sounds and intact distal pulses.  No murmur heard. Pulmonary/Chest: Effort normal and breath sounds normal. No respiratory distress. He has no wheezes. He has no rales.  Musculoskeletal: He exhibits no edema.  Skin: Skin is warm and dry. No rash noted.  Psychiatric: He has a normal mood and affect.  Nursing note and vitals  reviewed.  Results for orders placed or performed in visit on 36/64/40  Basic metabolic panel  Result Value Ref Range   Sodium 142 135 - 145 mEq/L   Potassium 4.2 3.5 - 5.1 mEq/L   Chloride 107 96 - 112 mEq/L   CO2 27 19 - 32 mEq/L   Glucose, Bld 137 (H) 70 - 99 mg/dL   BUN 15 6 - 23 mg/dL   Creatinine, Ser 1.07 0.40 - 1.50 mg/dL   Calcium 10.2 8.4 - 10.5 mg/dL   GFR 87.99 >60.00 mL/min  CBC with Differential/Platelet  Result Value Ref Range   WBC 4.0 4.0 - 10.5 K/uL   RBC 4.79 4.22 - 5.81 Mil/uL   Hemoglobin 14.6 13.0 - 17.0 g/dL   HCT 44.2 39.0 - 52.0 %   MCV 92.3 78.0 - 100.0 fl   MCHC 33.1 30.0 - 36.0 g/dL   RDW 13.0 11.5 - 15.5 %   Platelets 220.0 150.0 - 400.0 K/uL   Neutrophils Relative % 49.6 43.0 - 77.0 %   Lymphocytes Relative 38.0 12.0 - 46.0 %   Monocytes Relative 9.1 3.0 - 12.0 %   Eosinophils Relative 2.7 0.0 - 5.0 %   Basophils Relative 0.6 0.0 - 3.0 %   Neutro Abs 2.0 1.4 - 7.7 K/uL   Lymphs Abs 1.5 0.7 - 4.0 K/uL   Monocytes Absolute 0.4 0.1 - 1.0 K/uL   Eosinophils Absolute 0.1 0.0 - 0.7 K/uL   Basophils Absolute 0.0 0.0 - 0.1 K/uL  Sedimentation rate  Result Value Ref Range   Sed Rate 25 (H) 0 - 20 mm/hr  Uric acid  Result Value Ref Range   Uric Acid, Serum 8.2 (H) 4.0 - 7.8 mg/dL  Rheumatoid factor  Result Value Ref Range   Rhuematoid fact SerPl-aCnc <14 <14 IU/mL      Assessment & Plan:   Problem List Items Addressed This Visit    Erectile dysfunction - Primary    Anticipate vasculogenic erectile dysfunction. Discussed cause and treatment options. Pt interested in generic sildenafil, has investigated this in the past. Discussed side effects and adverse events to watch for including but not limited to headache, chest pain, priapism. Pt aware of need to avoid allnitrates. I asked him to check with transplant docs about PDE5 inhibitor use in setting of heart transplant. He mentions he has been prescribed this by them in the past and tolerated well.        H/O heart transplant ([place])       Follow up plan: Return if symptoms worsen or fail to improve.  Ria Bush, MD

## 2017-11-28 ENCOUNTER — Other Ambulatory Visit: Payer: Self-pay | Admitting: Family Medicine

## 2017-12-28 ENCOUNTER — Telehealth: Payer: Self-pay | Admitting: Family Medicine

## 2017-12-28 NOTE — Telephone Encounter (Signed)
Copied from [place]. Topic: Quick Communication - Rx Refill/Question >> Dec 28, 2017  9:08 AM Arletha Grippe wrote: Medication: sildenafil (REVATIO) 20 MG tablet   Has the patient contacted their pharmacy? No. Wants to change the rx   (Agent: If no, request that the patient contact the pharmacy for the refill.)   Preferred Pharmacy (with phone number or street name): [facility], wants to change dose to 40mg , says he needs a stronger strength   Agent: Please be advised that RX refills may take up to 3 business days. We ask that you follow-up with your pharmacy.

## 2017-12-28 NOTE — Telephone Encounter (Signed)
Please address pt's request for higher dose of Sildenafil

## 2017-12-29 NOTE — Telephone Encounter (Signed)
Remember - we chose generic sildenafil for cost and it only comes in 20mg  dose. He may take 2-4 tablets at a time (titrate to effect). It seems he's only been taking 1 at a time. Rx is written as 2-5 tablets as needed.

## 2017-12-29 NOTE — Telephone Encounter (Signed)
Patient checking status, requesting call

## 2017-12-29 NOTE — Telephone Encounter (Signed)
Left message on vm per dpr relaying Dr. G's message.  

## 2017-12-29 NOTE — Telephone Encounter (Signed)
Pt is calling to see if rx is ready please call [phone]

## 2017-12-30 ENCOUNTER — Telehealth: Payer: Self-pay | Admitting: Family Medicine

## 2017-12-30 MED ORDER — SILDENAFIL CITRATE 20 MG PO TABS: 40.0000 mg | ORAL_TABLET | Freq: Every day | ORAL | 0 refills | Status: DC | PRN

## 2017-12-30 NOTE — Telephone Encounter (Signed)
Pt following up on this refill request for   sildenafil (REVATIO) 20 MG tablet  Pt is requesting 35 tabs this RX   [id number] - [place], [place] [address] [phone] (Phone) [phone] (Fax)

## 2017-12-30 NOTE — Telephone Encounter (Signed)
Pt. called to ask why the refill for Sildenafil has not been ordered.  Advised that the message was that he was inquiring about a higher dose, and a refill request had not been received. Advised will send in a refill at this time.

## 2018-03-03 ENCOUNTER — Other Ambulatory Visit: Payer: Self-pay | Admitting: Family Medicine

## 2018-03-04 NOTE — Telephone Encounter (Signed)
Electronic refill request Last office visit 11/05/17 Last refill 12/30/17 #20

## 2018-04-06 ENCOUNTER — Other Ambulatory Visit: Payer: Self-pay | Admitting: Family Medicine

## 2018-07-07 HISTORY — PX: ESOPHAGOGASTRODUODENOSCOPY: SHX1529

## 2018-07-29 ENCOUNTER — Encounter: Payer: Self-pay | Admitting: Family Medicine

## 2018-09-17 ENCOUNTER — Encounter: Payer: Self-pay | Admitting: Family Medicine

## 2018-09-21 ENCOUNTER — Telehealth: Payer: Self-pay

## 2018-09-21 NOTE — Telephone Encounter (Signed)
Copied from [place] [phone]. Topic: Conservator, museum/gallery Patient (Clinic Use ONLY) >> Sep 19, 2018 11:41 AM [name] wrote: Reason for CRM: left message for patient to call back, needs Annual Wellness visit with Lattie Haw and then physical with Dr Carollee Herter, RMA >> Sep 21, 2018 10:26 AM [name] wrote: Pt declined an AWV, but did schedule his cpx with Dr Darnell Level on 10/19/18  Per today's note 09/21/18 from [facility] "Pt declined an AWV, but did schedule his cpx with Dr Darnell Level on 10/19/18"-[name], RMA

## 2018-10-09 ENCOUNTER — Other Ambulatory Visit: Payer: Self-pay | Admitting: Family Medicine

## 2018-10-09 DIAGNOSIS — E785 Hyperlipidemia, unspecified: Secondary | ICD-10-CM

## 2018-10-09 DIAGNOSIS — E559 Vitamin D deficiency, unspecified: Secondary | ICD-10-CM

## 2018-10-09 DIAGNOSIS — Z941 Heart transplant status: Secondary | ICD-10-CM

## 2018-10-09 DIAGNOSIS — N400 Enlarged prostate without lower urinary tract symptoms: Secondary | ICD-10-CM

## 2018-10-09 DIAGNOSIS — I10 Essential (primary) hypertension: Secondary | ICD-10-CM

## 2018-10-09 DIAGNOSIS — M1000 Idiopathic gout, unspecified site: Secondary | ICD-10-CM

## 2018-10-12 ENCOUNTER — Other Ambulatory Visit (INDEPENDENT_AMBULATORY_CARE_PROVIDER_SITE_OTHER): Payer: Medicare Other

## 2018-10-12 DIAGNOSIS — E559 Vitamin D deficiency, unspecified: Secondary | ICD-10-CM | POA: Diagnosis not present

## 2018-10-12 DIAGNOSIS — Z941 Heart transplant status: Secondary | ICD-10-CM

## 2018-10-12 DIAGNOSIS — M1000 Idiopathic gout, unspecified site: Secondary | ICD-10-CM | POA: Diagnosis not present

## 2018-10-12 DIAGNOSIS — E785 Hyperlipidemia, unspecified: Secondary | ICD-10-CM | POA: Diagnosis not present

## 2018-10-12 DIAGNOSIS — I10 Essential (primary) hypertension: Secondary | ICD-10-CM | POA: Diagnosis not present

## 2018-10-12 DIAGNOSIS — N400 Enlarged prostate without lower urinary tract symptoms: Secondary | ICD-10-CM | POA: Diagnosis not present

## 2018-10-12 LAB — URIC ACID: Uric Acid, Serum: 6.1 mg/dL (ref 4.0–7.8)

## 2018-10-12 LAB — CBC WITH DIFFERENTIAL/PLATELET
BASOS PCT: 0.6 % (ref 0.0–3.0)
Basophils Absolute: 0 10*3/uL (ref 0.0–0.1)
EOS ABS: 0.2 10*3/uL (ref 0.0–0.7)
Eosinophils Relative: 3.4 % (ref 0.0–5.0)
HCT: 44 % (ref 39.0–52.0)
Hemoglobin: 14.6 g/dL (ref 13.0–17.0)
LYMPHS ABS: 1.5 10*3/uL (ref 0.7–4.0)
LYMPHS PCT: 30.2 % (ref 12.0–46.0)
MCHC: 33.1 g/dL (ref 30.0–36.0)
MCV: 91.4 fl (ref 78.0–100.0)
MONOS PCT: 8.9 % (ref 3.0–12.0)
Monocytes Absolute: 0.4 10*3/uL (ref 0.1–1.0)
NEUTROS ABS: 2.8 10*3/uL (ref 1.4–7.7)
NEUTROS PCT: 56.9 % (ref 43.0–77.0)
PLATELETS: 219 10*3/uL (ref 150.0–400.0)
RBC: 4.81 Mil/uL (ref 4.22–5.81)
RDW: 13.6 % (ref 11.5–15.5)
WBC: 5 10*3/uL (ref 4.0–10.5)

## 2018-10-12 LAB — COMPREHENSIVE METABOLIC PANEL
ALBUMIN: 4.4 g/dL (ref 3.5–5.2)
ALK PHOS: 71 U/L (ref 39–117)
ALT: 9 U/L (ref 0–53)
AST: 12 U/L (ref 0–37)
BILIRUBIN TOTAL: 0.4 mg/dL (ref 0.2–1.2)
BUN: 20 mg/dL (ref 6–23)
CO2: 28 mEq/L (ref 19–32)
Calcium: 9.7 mg/dL (ref 8.4–10.5)
Chloride: 106 mEq/L (ref 96–112)
Creatinine, Ser: 1.15 mg/dL (ref 0.40–1.50)
GFR: 80.65 mL/min (ref >60.00)
GLUCOSE: 112 mg/dL — AB (ref 70–99)
POTASSIUM: 4.8 meq/L (ref 3.5–5.1)
Sodium: 141 mEq/L (ref 135–145)
TOTAL PROTEIN: 6.9 g/dL (ref 6.0–8.3)

## 2018-10-12 LAB — LIPID PANEL
CHOLESTEROL: 174 mg/dL (ref 0–200)
HDL: 39.3 mg/dL (ref >39.00)
LDL Cholesterol: 95 mg/dL (ref 0–99)
NonHDL: 135.06
TRIGLYCERIDES: 199 mg/dL — AB (ref 0.0–149.0)
Total CHOL/HDL Ratio: 4
VLDL: 39.8 mg/dL (ref 0.0–40.0)

## 2018-10-12 LAB — MICROALBUMIN / CREATININE URINE RATIO
CREATININE, U: 147.3 mg/dL
MICROALB UR: 13.1 mg/dL — AB (ref 0.0–1.9)
Microalb Creat Ratio: 8.9 mg/g (ref 0.0–30.0)

## 2018-10-12 LAB — PSA: PSA: 2.26 ng/mL (ref 0.10–4.00)

## 2018-10-12 LAB — TSH: TSH: 3.12 u[IU]/mL (ref 0.35–4.50)

## 2018-10-12 LAB — MAGNESIUM: Magnesium: 1.9 mg/dL (ref 1.5–2.5)

## 2018-10-12 LAB — VITAMIN D 25 HYDROXY (VIT D DEFICIENCY, FRACTURES): VITD: 59.19 ng/mL (ref 30.00–100.00)

## 2018-10-19 ENCOUNTER — Encounter: Payer: Self-pay | Admitting: Family Medicine

## 2018-10-19 ENCOUNTER — Ambulatory Visit (INDEPENDENT_AMBULATORY_CARE_PROVIDER_SITE_OTHER): Payer: Medicare Other | Admitting: Family Medicine

## 2018-10-19 VITALS — BP 118/64 | HR 79 | Temp 97.8°F | Ht 67.5 in | Wt 186.2 lb

## 2018-10-19 DIAGNOSIS — Z0000 Encounter for general adult medical examination without abnormal findings: Secondary | ICD-10-CM | POA: Diagnosis not present

## 2018-10-19 DIAGNOSIS — G250 Essential tremor: Secondary | ICD-10-CM

## 2018-10-19 DIAGNOSIS — M1A00X0 Idiopathic chronic gout, unspecified site, without tophus (tophi): Secondary | ICD-10-CM

## 2018-10-19 DIAGNOSIS — Z941 Heart transplant status: Secondary | ICD-10-CM

## 2018-10-19 DIAGNOSIS — Z7189 Other specified counseling: Secondary | ICD-10-CM

## 2018-10-19 DIAGNOSIS — E559 Vitamin D deficiency, unspecified: Secondary | ICD-10-CM | POA: Diagnosis not present

## 2018-10-19 DIAGNOSIS — I10 Essential (primary) hypertension: Secondary | ICD-10-CM

## 2018-10-19 DIAGNOSIS — Z23 Encounter for immunization: Secondary | ICD-10-CM | POA: Diagnosis not present

## 2018-10-19 DIAGNOSIS — E785 Hyperlipidemia, unspecified: Secondary | ICD-10-CM

## 2018-10-19 DIAGNOSIS — M810 Age-related osteoporosis without current pathological fracture: Secondary | ICD-10-CM

## 2018-10-19 DIAGNOSIS — D3709 Neoplasm of uncertain behavior of other specified sites of the oral cavity: Secondary | ICD-10-CM

## 2018-10-19 DIAGNOSIS — N400 Enlarged prostate without lower urinary tract symptoms: Secondary | ICD-10-CM

## 2018-10-19 DIAGNOSIS — N529 Male erectile dysfunction, unspecified: Secondary | ICD-10-CM

## 2018-10-19 MED ORDER — SILDENAFIL CITRATE 20 MG PO TABS: 40.0000 mg | ORAL_TABLET | Freq: Every day | ORAL | 11 refills | Status: DC | PRN

## 2018-10-19 NOTE — Assessment & Plan Note (Addendum)
Appreciate [facility] transplant team care - on cellcept, prograf. Unclear if still on prednisone.

## 2018-10-19 NOTE — Assessment & Plan Note (Addendum)
Preventative protocols reviewed and updated unless pt declined. Discussed healthy diet and lifestyle.  

## 2018-10-19 NOTE — Assessment & Plan Note (Signed)
Chronic, stable on current regimen - continue. 

## 2018-10-19 NOTE — Assessment & Plan Note (Signed)
Stable continue propranolol 

## 2018-10-19 NOTE — Assessment & Plan Note (Addendum)
I asked him to contact GI f/u recent ENT referral (has not heard yet).

## 2018-10-19 NOTE — Assessment & Plan Note (Signed)
Followed by [facility], [place] 12/2017 showing osteoporosis largely unchanged (T score -2.5). Off fosamax. I suggested he check with [facility] heart transplant team on possible prolia. Continues calcium, vit D, weight bearing exercise

## 2018-10-19 NOTE — Patient Instructions (Addendum)
Flu shot today Call Dr [facility] office to follow up on ENT referral (Dr Constance Holster).  Ask about prolia for bone strength (after you finished fosamax course).  If interested, check with pharmacy about new 2 shot shingles series (shingrix). First check with transplant docs to see if you're good candidate for this.  You are doing well today Return as needed or in 1 year for next wellness visit.  Health Maintenance, Male A healthy lifestyle and preventive care is important for your health and wellness. Ask your health care provider about what schedule of regular examinations is right for you. What should I know about weight and diet? Eat a Healthy Diet  Eat plenty of vegetables, fruits, whole grains, low-fat dairy products, and lean protein.  Do not eat a lot of foods high in solid fats, added sugars, or salt.  Maintain a Healthy Weight Regular exercise can help you achieve or maintain a healthy weight. You should:  Do at least 150 minutes of exercise each week. The exercise should increase your heart rate and make you sweat (moderate-intensity exercise).  Do strength-training exercises at least twice a week.  Watch Your Levels of Cholesterol and Blood Lipids  Have your blood tested for lipids and cholesterol every 5 years starting at 71 years of age. If you are at high risk for heart disease, you should start having your blood tested when you are 71 years old. You may need to have your cholesterol levels checked more often if: ? Your lipid or cholesterol levels are high. ? You are older than 71 years of age. ? You are at high risk for heart disease.  What should I know about cancer screening? Many types of cancers can be detected early and may often be prevented. Lung Cancer  You should be screened every year for lung cancer if: ? You are a current smoker who has smoked for at least 30 years. ? You are a former smoker who has quit within the past 15 years.  Talk to your health care  provider about your screening options, when you should start screening, and how often you should be screened.  Colorectal Cancer  Routine colorectal cancer screening usually begins at 71 years of age and should be repeated every 5-10 years until you are 71 years old. You may need to be screened more often if early forms of precancerous polyps or small growths are found. Your health care provider may recommend screening at an earlier age if you have risk factors for colon cancer.  Your health care provider may recommend using home test kits to check for hidden blood in the stool.  A small camera at the end of a tube can be used to examine your colon (sigmoidoscopy or colonoscopy). This checks for the earliest forms of colorectal cancer.  Prostate and Testicular Cancer  Depending on your age and overall health, your health care provider may do certain tests to screen for prostate and testicular cancer.  Talk to your health care provider about any symptoms or concerns you have about testicular or prostate cancer.  Skin Cancer  Check your skin from head to toe regularly.  Tell your health care provider about any new moles or changes in moles, especially if: ? There is a change in a mole's size, shape, or color. ? You have a mole that is larger than a pencil eraser.  Always use sunscreen. Apply sunscreen liberally and repeat throughout the day.  Protect yourself by wearing long sleeves, pants,  a wide-brimmed hat, and sunglasses when outside.  What should I know about heart disease, diabetes, and high blood pressure?  If you are 20-60 years of age, have your blood pressure checked every 3-5 years. If you are 72 years of age or older, have your blood pressure checked every year. You should have your blood pressure measured twice-once when you are at a hospital or clinic, and once when you are not at a hospital or clinic. Record the average of the two measurements. To check your blood pressure  when you are not at a hospital or clinic, you can use: ? An automated blood pressure machine at a pharmacy. ? A home blood pressure monitor.  Talk to your health care provider about your target blood pressure.  If you are between 40-48 years old, ask your health care provider if you should take aspirin to prevent heart disease.  Have regular diabetes screenings by checking your fasting blood sugar level. ? If you are at a normal weight and have a low risk for diabetes, have this test once every three years after the age of 28. ? If you are overweight and have a high risk for diabetes, consider being tested at a younger age or more often.  A one-time screening for abdominal aortic aneurysm (AAA) by ultrasound is recommended for men aged 32-75 years who are current or former smokers. What should I know about preventing infection? Hepatitis B If you have a higher risk for hepatitis B, you should be screened for this virus. Talk with your health care provider to find out if you are at risk for hepatitis B infection. Hepatitis C Blood testing is recommended for:  Everyone born from [birth year] through 1964.  Anyone with known risk factors for hepatitis C.  Sexually Transmitted Diseases (STDs)  You should be screened each year for STDs including gonorrhea and chlamydia if: ? You are sexually active and are younger than 71 years of age. ? You are older than 71 years of age and your health care provider tells you that you are at risk for this type of infection. ? Your sexual activity has changed since you were last screened and you are at an increased risk for chlamydia or gonorrhea. Ask your health care provider if you are at risk.  Talk with your health care provider about whether you are at high risk of being infected with HIV. Your health care provider may recommend a prescription medicine to help prevent HIV infection.  What else can I do?  Schedule regular health, dental, and eye  exams.  Stay current with your vaccines (immunizations).  Do not use any tobacco products, such as cigarettes, chewing tobacco, and e-cigarettes. If you need help quitting, ask your health care provider.  Limit alcohol intake to no more than 2 drinks per day. One drink equals 12 ounces of beer, 5 ounces of wine, or 1 ounces of hard liquor.  Do not use street drugs.  Do not share needles.  Ask your health care provider for help if you need support or information about quitting drugs.  Tell your health care provider if you often feel depressed.  Tell your health care provider if you have ever been abused or do not feel safe at home. This information is not intended to replace advice given to you by your health care provider. Make sure you discuss any questions you have with your health care provider. Document Released: 05/21/2008 Document Revised: 07/22/2016 Document Reviewed: 08/27/2015 Elsevier Interactive  Patient Education  [employer].

## 2018-10-19 NOTE — Assessment & Plan Note (Signed)
Generic sildenafil effective.

## 2018-10-19 NOTE — Assessment & Plan Note (Signed)

## 2018-10-19 NOTE — Assessment & Plan Note (Signed)
Chronic, stable. Continue pravastatin.  The 10-year ASCVD risk score Mikey Bussing DC Brooke Bonito., et al., 2013) is: 17%   Values used to calculate the score:     Age: 72 years     Sex: Male     Is Non-Hispanic African American: Yes     Diabetic: No     Tobacco smoker: No     Systolic Blood Pressure: 359 mmHg     Is BP treated: Yes     HDL Cholesterol: 39.3 mg/dL     Total Cholesterol: 174 mg/dL

## 2018-10-19 NOTE — Assessment & Plan Note (Signed)
Continue f/u with urology. PSA stable.

## 2018-10-19 NOTE — Assessment & Plan Note (Signed)
Levels repleted on current regimen - continue.

## 2018-10-19 NOTE — Assessment & Plan Note (Signed)
Advanced planning: has living will at home. HCPOA is sister Kavin Leech - may change to wife (recently married). Will bring me copy.

## 2018-10-19 NOTE — Assessment & Plan Note (Signed)
Chronic, stable on low dose allopurinol. No recent gout flare. Discussed vit C and OJ. Discussed colchicine PRN flare.

## 2018-10-19 NOTE — Progress Notes (Signed)
BP 118/64 (BP Location: Left Arm, Patient Position: Sitting, Cuff Size: Normal)   Pulse 79   Temp 97.8 F (36.6 C) (Oral)   Ht 5' 7.5" (1.715 m)   Wt 186 lb 4 oz (84.5 kg)   SpO2 98%   BMI 28.74 kg/m    CC: CPE Subjective:    Patient ID: Frederick Stephens, male    DOB: Apr 11, 1946, 72 y.o.   MRN: [id number]  HPI: Frederick Stephens is a 72 y.o. male presenting on 10/19/2018 for Medicare Wellness (Wants to dicuss sildenafil. 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 Rosalin Hawking and Leonides Schanz). On prograf and cellcept - taking fosamax once weekly for osteoporosis. Has stress test regularly.  Dysphagia followed by [facility] s/p EGD 07/2018 - esophageal strictures dilated with improvement, soft palate polyp noted, referred to ENT (appt pending). On nexium 20mg  daily (rec trial off this by GI) - breakthrough symptoms if missed dose. Latest note reviewed.   Taking generic sildenafil - requests refill.   Did not see Katha Cabal this year.    Hearing Screening   125Hz  250Hz  500Hz  1000Hz  2000Hz  3000Hz  4000Hz  6000Hz  8000Hz   Right ear:   25 40 20  0    Left ear:   40 40 40  0      Visual Acuity Screening   Right eye Left eye Both eyes  Without correction:     With correction: 20/25 20/30 20/20   Depression screen - passed Fall screen - passed  Preventative: Colonoscopy - normal 10/2014 [facility]) Prostate - sees urologist yearly. PSA 3.97 11/2014. H/o BPH. Ongoing pain with ejaculation.  DEXA (unsure date) - T -2.5 hip (osteoporosis) s/p 12yrs alendronate. Sees endocrinology. Rpt DEXA 12/2017 - osteoporosis T score of -2.5 (hip) consider rpt 2 yrs. Continues calcium/vit D.  Flu shot - yearly Prevnar 2016. Pneumovax 08/2016 Tdap 11/2017 zostavax - not candidate shingrix - discussed. H/o shingles in the past Advanced planning: has living will at home. HCPOA is sister Kavin Leech - may change to wife (recently married). Will bring me copy.  Seat belt  use discussed Sunscreen use discussed. No changing moles on skin. sees derm yearly. Non smoker Alcohol - none Dentist q6 mo  Eye exam due   Lives with wife (2018) Son lives nearby - as well as extended family in [place]. 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 Interim medical history since our last visit reviewed. Allergies and medications reviewed and updated. Outpatient Medications Prior to Visit  Medication Sig Dispense Refill  . alendronate (FOSAMAX) 70 MG tablet Take 70 mg by mouth every Friday. Take with a full glass of water on an empty stomach.     Marland Kitchen allopurinol (ZYLOPRIM) 100 MG tablet Take 1 tablet (100 mg total) by mouth daily.    Marland Kitchen amLODipine (NORVASC) 2.5 MG tablet Take 2.5 mg by mouth 2 (two) times daily.    Marland Kitchen amoxicillin (AMOXIL) 500 MG capsule Take 4 capsules (2,000 mg total) by mouth daily as needed (for dental procedures). Reported on 04/14/2016 4 capsule 3  . aspirin 81 MG tablet Take 81 mg by mouth daily.    . Calcium Carb-Cholecalciferol (CALCIUM-VITAMIN D) 600-400 MG-UNIT TABS Take 1 tablet by mouth daily.    . cholecalciferol (VITAMIN D) 1000 units tablet Take 1,000 Units by mouth  daily.    Marland Kitchen COLCRYS 0.6 MG tablet TAKE 1 TABLET (0.6 MG TOTAL) BY MOUTH 2 (TWO) TIMES DAILY. AS NEEDED GOUT FLARE 30 tablet 2  . HYDROcodone-acetaminophen (NORCO/VICODIN) 5-325 MG tablet Take 1 tablet by mouth every 6 (six) hours as needed for moderate pain. 15 tablet 0  . hydrocortisone (ANUSOL-HC) 2.5 % rectal cream Place 1 application rectally daily as needed.    Marland Kitchen ibuprofen (ADVIL,MOTRIN) 600 MG tablet Take 1 tablet (600 mg total) by mouth every 6 (six) hours as needed. 30 tablet 0  . Magnesium 400 MG TABS Take 1 tablet by mouth 3 (three) times daily.    . mycophenolate (CELLCEPT) 250 MG capsule Take 500 mg by mouth  2 (two) times daily.    . ondansetron (ZOFRAN) 4 MG tablet Take 1 tablet (4 mg total) by mouth every 6 (six) hours. 12 tablet 0  . pravastatin (PRAVACHOL) 20 MG tablet Take 20 mg by mouth every evening.    . predniSONE (DELTASONE) 20 MG tablet 2 tabs po for 6 days, then 1 tab po for 6 days 18 tablet 0  . propranolol (INDERAL) 80 MG tablet Take 80 mg by mouth daily.    . tacrolimus (PROGRAF) 0.5 MG capsule Take 0.5 mg by mouth 2 (two) times daily.    . sildenafil (REVATIO) 20 MG tablet TAKE 2-5 TABLETS (40-100 MG TOTAL) BY MOUTH DAILY AS NEEDED. 20 tablet 3   No facility-administered medications prior to visit.      Per HPI unless specifically indicated in ROS section below Review of Systems  Constitutional: Negative for activity change, appetite change, chills, fatigue, fever and unexpected weight change.  HENT: Positive for sore throat (of a few days duration recently). Negative for hearing loss.   Eyes: Negative for visual disturbance.  Respiratory: Negative for cough, chest tightness, shortness of breath and wheezing.   Cardiovascular: Negative for chest pain, palpitations and leg swelling.  Gastrointestinal: Negative for abdominal distention, abdominal pain, blood in stool, constipation, diarrhea, nausea and vomiting.  Genitourinary: Negative for difficulty urinating and hematuria.  Musculoskeletal: Negative for arthralgias, myalgias and neck pain.  Skin: Negative for rash.  Neurological: Negative for dizziness, seizures, syncope and headaches.  Hematological: Negative for adenopathy. Does not bruise/bleed easily.  Psychiatric/Behavioral: Negative for dysphoric mood. The patient is not nervous/anxious.        Objective:    BP 118/64 (BP Location: Left Arm, Patient Position: Sitting, Cuff Size: Normal)   Pulse 79   Temp 97.8 F (36.6 C) (Oral)   Ht 5' 7.5" (1.715 m)   Wt 186 lb 4 oz (84.5 kg)   SpO2 98%   BMI 28.74 kg/m   Wt Readings from Last 3 Encounters:  10/19/18 186 lb  4 oz (84.5 kg)  11/05/17 182 lb (82.6 kg)  06/24/17 183 lb 12 oz (83.3 kg)    Physical Exam  Constitutional: He is oriented to person, place, and time. He appears well-developed and well-nourished. No distress.  HENT:  Head: Normocephalic and atraumatic.  Right Ear: Hearing, tympanic membrane, external ear and ear canal normal.  Left Ear: Hearing, tympanic membrane, external ear and ear canal normal.  Nose: Nose normal.  Mouth/Throat: Uvula is midline, oropharynx is clear and moist and mucous membranes are normal. No oropharyngeal exudate, posterior oropharyngeal edema or posterior oropharyngeal erythema.  Eyes: Pupils are equal, round, and reactive to light. Conjunctivae and EOM are normal. No scleral icterus.  Neck: Normal range of motion. Neck supple. Carotid bruit is not  present. No thyromegaly present.  Cardiovascular: Normal rate, regular rhythm, normal heart sounds and intact distal pulses.  No murmur heard. Pulses:      Radial pulses are 2+ on the right side, and 2+ on the left side.  Pulmonary/Chest: Effort normal and breath sounds normal. No respiratory distress. He has no wheezes. He has no rales.  Mild crackles bibasilarly  Abdominal: Soft. Bowel sounds are normal. He exhibits no distension and no mass. There is no tenderness. There is no rebound and no guarding.  Musculoskeletal: Normal range of motion. He exhibits no edema.  Lymphadenopathy:    He has no cervical adenopathy.  Neurological: He is alert and oriented to person, place, and time.  CN grossly intact, station and gait intact Recall 3/3  Calculation 5/5 serial 7s  Skin: Skin is warm and dry. No rash noted.  Psychiatric: He has a normal mood and affect. His behavior is normal. Judgment and thought content normal.  Nursing note and vitals reviewed.  Results for orders placed or performed in visit on 10/12/18  Magnesium  Result Value Ref Range   Magnesium 1.9 1.5 - 2.5 mg/dL  TSH  Result Value Ref Range   TSH  3.12 0.35 - 4.50 uIU/mL  CBC with Differential/Platelet  Result Value Ref Range   WBC 5.0 4.0 - 10.5 K/uL   RBC 4.81 4.22 - 5.81 Mil/uL   Hemoglobin 14.6 13.0 - 17.0 g/dL   HCT 44.0 39.0 - 52.0 %   MCV 91.4 78.0 - 100.0 fl   MCHC 33.1 30.0 - 36.0 g/dL   RDW 13.6 11.5 - 15.5 %   Platelets 219.0 150.0 - 400.0 K/uL   Neutrophils Relative % 56.9 43.0 - 77.0 %   Lymphocytes Relative 30.2 12.0 - 46.0 %   Monocytes Relative 8.9 3.0 - 12.0 %   Eosinophils Relative 3.4 0.0 - 5.0 %   Basophils Relative 0.6 0.0 - 3.0 %   Neutro Abs 2.8 1.4 - 7.7 K/uL   Lymphs Abs 1.5 0.7 - 4.0 K/uL   Monocytes Absolute 0.4 0.1 - 1.0 K/uL   Eosinophils Absolute 0.2 0.0 - 0.7 K/uL   Basophils Absolute 0.0 0.0 - 0.1 K/uL  Microalbumin / creatinine urine ratio  Result Value Ref Range   Microalb, Ur 13.1 (H) 0.0 - 1.9 mg/dL   Creatinine,U 147.3 mg/dL   Microalb Creat Ratio 8.9 0.0 - 30.0 mg/g  Uric acid  Result Value Ref Range   Uric Acid, Serum 6.1 4.0 - 7.8 mg/dL  PSA  Result Value Ref Range   PSA 2.26 0.10 - 4.00 ng/mL  Comprehensive metabolic panel  Result Value Ref Range   Sodium 141 135 - 145 mEq/L   Potassium 4.8 3.5 - 5.1 mEq/L   Chloride 106 96 - 112 mEq/L   CO2 28 19 - 32 mEq/L   Glucose, Bld 112 (H) 70 - 99 mg/dL   BUN 20 6 - 23 mg/dL   Creatinine, Ser 1.15 0.40 - 1.50 mg/dL   Total Bilirubin 0.4 0.2 - 1.2 mg/dL   Alkaline Phosphatase 71 39 - 117 U/L   AST 12 0 - 37 U/L   ALT 9 0 - 53 U/L   Total Protein 6.9 6.0 - 8.3 g/dL   Albumin 4.4 3.5 - 5.2 g/dL   Calcium 9.7 8.4 - 10.5 mg/dL   GFR 80.65 >60.00 mL/min  Lipid panel  Result Value Ref Range   Cholesterol 174 0 - 200 mg/dL   Triglycerides 199.0 (H) 0.0 -  149.0 mg/dL   HDL 39.30 >39.00 mg/dL   VLDL 39.8 0.0 - 40.0 mg/dL   LDL Cholesterol 95 0 - 99 mg/dL   Total CHOL/HDL Ratio 4    NonHDL 135.06   VITAMIN D 25 Hydroxy (Vit-D Deficiency, Fractures)  Result Value Ref Range   VITD 59.19 30.00 - 100.00 ng/mL      Assessment &  Plan:   Problem List Items Addressed This Visit    Vitamin D deficiency    Levels repleted on current regimen - continue.      Osteoporosis    Followed by [facility], [place] 12/2017 showing osteoporosis largely unchanged (T score -2.5). Off fosamax. I suggested he check with [facility] heart transplant team on possible prolia. Continues calcium, vit D, weight bearing exercise      Neoplasm of uncertain behavior of palate    I asked him to contact GI f/u recent ENT referral (has not heard yet).      Medicare annual wellness visit, subsequent - Primary    I have personally reviewed the Medicare Annual Wellness questionnaire and have noted 1. The patient's medical and social history 2. Their use of alcohol, tobacco or illicit drugs 3. Their current medications and supplements 4. The patient's functional ability including ADL's, fall risks, home safety risks and hearing or visual impairment. Cognitive function has been assessed and addressed as indicated.  5. Diet and physical activity 6. Evidence for depression or mood disorders The patients weight, height, BMI have been recorded in the chart. I have made referrals, counseling and provided education to the patient based on review of the above and I have provided the pt with a written personalized care plan for preventive services. Provider list updated.. See scanned questionairre as needed for further documentation. Reviewed preventative protocols and updated unless pt declined.       HTN (hypertension)    Chronic, stable on current regimen - continue.       Relevant Medications   sildenafil (REVATIO) 20 MG tablet   HLD (hyperlipidemia)    Chronic, stable. Continue pravastatin.  The 10-year ASCVD risk score Mikey Bussing DC Brooke Bonito., et al., 2013) is: 17%   Values used to calculate the score:     Age: 46 years     Sex: Male     Is Non-Hispanic African American: Yes     Diabetic: No     Tobacco smoker: No     Systolic Blood Pressure: 315 mmHg      Is BP treated: Yes     HDL Cholesterol: 39.3 mg/dL     Total Cholesterol: 174 mg/dL       Relevant Medications   sildenafil (REVATIO) 20 MG tablet   Health maintenance examination    Preventative protocols reviewed and updated unless pt declined. Discussed healthy diet and lifestyle.       H/O heart transplant [facility])    Appreciate [facility] transplant team care - on cellcept, prograf. Unclear if still on prednisone.       Gout    Chronic, stable on low dose allopurinol. No recent gout flare. Discussed vit C and OJ. Discussed colchicine PRN flare.       Essential tremor    Stable - continue propranolol.      Erectile dysfunction    Generic sildenafil effective.       BPH (benign prostatic hyperplasia)    Continue f/u with urology. PSA stable.       Advanced care planning/counseling discussion    Advanced  planning: has living will at home. HCPOA is sister Kavin Leech - may change to wife (recently married). Will bring me copy.        Other Visit Diagnoses    Need for influenza vaccination       Relevant Orders   Flu Vaccine QUAD 36+ mos IM (Completed)       Meds ordered this encounter  Medications  . sildenafil (REVATIO) 20 MG tablet    Sig: Take 2 tablets (40 mg total) by mouth daily as needed.    Dispense:  30 tablet    Refill:  11   Orders Placed This Encounter  Procedures  . Flu Vaccine QUAD 36+ mos IM    Follow up plan: Return in about 1 year (around 10/20/2019) for annual exam, prior fasting for blood work, medicare wellness visit.  Ria Bush, MD

## 2018-12-03 ENCOUNTER — Other Ambulatory Visit: Payer: Self-pay | Admitting: Family Medicine

## 2018-12-05 ENCOUNTER — Telehealth: Payer: Self-pay | Admitting: Family Medicine

## 2018-12-05 MED ORDER — ALLOPURINOL 100 MG PO TABS: 100.0000 mg | ORAL_TABLET | Freq: Every day | ORAL | 1 refills | Status: DC

## 2018-12-05 MED ORDER — COLCRYS 0.6 MG PO TABS: ORAL_TABLET | ORAL | 2 refills | Status: DC

## 2018-12-05 NOTE — Telephone Encounter (Signed)
Pt called office stating he needs a 7 day supply of the allopurinol. His 90 day supply comes by mail order but he is leaving to go out of town today at 12pm and he needs a 7 day supply to last him until he comes back. Pt uses [facility] in [place].

## 2018-12-05 NOTE — Telephone Encounter (Signed)
plz call patient - allopuinol 10 d course sent in. However, allopurinol will not treat acute gout flare - is he taking colchicine? I have sent refill. Would offer prednisone taper to treat acute flare as well if desired.

## 2018-12-05 NOTE — Telephone Encounter (Signed)
[facility] faxed note that pt is going out of town and requesting allopurinol rx until gets mail order Allopurinol. Pt having gout flare up in rt foot around the ankle which began on 12/02/18. Pt having difficulty walking with swelling and pain. Does pt need allopurinol or different med since gout flare up? TH note in Dr Danise Mina in box.

## 2018-12-06 NOTE — Telephone Encounter (Signed)
Left message on vm per dpr relaying Dr. Synthia Innocent message. Asked pt to call back if he wants to start prednisone.

## 2018-12-09 NOTE — Telephone Encounter (Addendum)
Spoke with pt to confirm he received vm and ask about colchicine.  States he did get the message, he is taking coldhicine and picked up the allopurinol. Says the flare ended Tues,12/06/18 so he will not need the prednisone. Fyi to Dr. Darnell Level.

## 2018-12-13 ENCOUNTER — Other Ambulatory Visit: Payer: Self-pay | Admitting: Family Medicine

## 2018-12-13 NOTE — Telephone Encounter (Signed)
pts states he has a dental appt Thursday 12/15/2018

## 2018-12-13 NOTE — Telephone Encounter (Signed)
plz notify this was sent in for him.

## 2018-12-13 NOTE — Telephone Encounter (Signed)
Spoke with pt notifying him amoxicillin was sent to the pharmacy.  Pt verbalizes understanding and expresses his thanks.

## 2018-12-28 ENCOUNTER — Encounter: Payer: Self-pay | Admitting: Family Medicine

## 2018-12-28 DIAGNOSIS — J392 Other diseases of pharynx: Secondary | ICD-10-CM | POA: Insufficient documentation

## 2019-01-31 ENCOUNTER — Encounter: Payer: Self-pay | Admitting: Family Medicine

## 2019-01-31 ENCOUNTER — Ambulatory Visit: Payer: Medicare Other | Admitting: Family Medicine

## 2019-01-31 ENCOUNTER — Ambulatory Visit (INDEPENDENT_AMBULATORY_CARE_PROVIDER_SITE_OTHER): Payer: Medicare Other | Admitting: Family Medicine

## 2019-01-31 VITALS — BP 118/84 | HR 82 | Temp 97.6°F | Ht 67.5 in | Wt 186.0 lb

## 2019-01-31 DIAGNOSIS — Z941 Heart transplant status: Secondary | ICD-10-CM | POA: Diagnosis not present

## 2019-01-31 DIAGNOSIS — M25571 Pain in right ankle and joints of right foot: Secondary | ICD-10-CM | POA: Diagnosis not present

## 2019-01-31 DIAGNOSIS — M1A00X0 Idiopathic chronic gout, unspecified site, without tophus (tophi): Secondary | ICD-10-CM | POA: Diagnosis not present

## 2019-01-31 MED ORDER — PREDNISONE 20 MG PO TABS: ORAL_TABLET | ORAL | 0 refills | Status: DC

## 2019-01-31 NOTE — Progress Notes (Signed)
BP 118/84 (BP Location: Left Arm, Patient Position: Sitting, Cuff Size: Normal)   Pulse 82   Temp 97.6 F (36.4 C) (Oral)   Ht 5' 7.5" (1.715 m)   Wt 186 lb (84.4 kg)   SpO2 95%   BMI 28.70 kg/m    CC: gout Subjective:    Patient ID: Frederick Stephens, male    DOB: 1946-01-07, 73 y.o.   MRN: [id number]  HPI: Frederick Stephens is a 73 y.o. male presenting on 01/31/2019 for Gout (C/o gout flare in right ankle and foot. Started 1 wk ago. Wants to discuss gout meds. Also, requests uric acid test.  Pt accompanied by his wife, Carletta.)   1 wk h/o R ankle pain and sweling. Most noticeable when he puts pressure on foot, walking with a cane. Woke up with medial ankle pain/swelling. Denies inciting trauma/injury or falls. Thinks flare may have been precipitated by transplant medicine. Denies diet changes to precipitate flare.   Regularly takes allopurinol 100mg  daily. Started colcrys bid last week. Tried ibuprofen x1.   S/p heart transplant on prograf and cellcept. No recent changes.  He prior had gout flare 11/2018     Relevant past medical, surgical, family and social history reviewed and updated as indicated. Interim medical history since our last visit reviewed. Allergies and medications reviewed and updated. Outpatient Medications Prior to Visit  Medication Sig Dispense Refill  . alendronate (FOSAMAX) 70 MG tablet Take 70 mg by mouth every Friday. Take with a full glass of water on an empty stomach.     Marland Kitchen allopurinol (ZYLOPRIM) 100 MG tablet Take 1 tablet (100 mg total) by mouth daily. 10 tablet 1  . amLODipine (NORVASC) 2.5 MG tablet Take 2.5 mg by mouth 2 (two) times daily.    Marland Kitchen amoxicillin (AMOXIL) 500 MG capsule TAKE 4 CAPSULES BY MOUTH DAILY AS NEEDED (FOR DENTAL PROCEDURES). REPORTED ON 04/14/2016 4 capsule 3  . aspirin 81 MG tablet Take 81 mg by mouth daily.    . Calcium Carb-Cholecalciferol (CALCIUM-VITAMIN D) 600-400 MG-UNIT TABS Take 1 tablet by mouth daily.    . cholecalciferol  (VITAMIN D) 1000 units tablet Take 1,000 Units by mouth daily.    Marland Kitchen COLCRYS 0.6 MG tablet TAKE 1 TABLET (0.6 MG TOTAL) BY MOUTH 2 (TWO) TIMES DAILY. AS NEEDED GOUT FLARE 30 tablet 2  . HYDROcodone-acetaminophen (NORCO/VICODIN) 5-325 MG tablet Take 1 tablet by mouth every 6 (six) hours as needed for moderate pain. 15 tablet 0  . hydrocortisone (ANUSOL-HC) 2.5 % rectal cream Place 1 application rectally daily as needed.    Marland Kitchen ibuprofen (ADVIL,MOTRIN) 600 MG tablet Take 1 tablet (600 mg total) by mouth every 6 (six) hours as needed. 30 tablet 0  . Magnesium 400 MG TABS Take 1 tablet by mouth 3 (three) times daily.    . mycophenolate (CELLCEPT) 250 MG capsule Take 500 mg by mouth 2 (two) times daily.    . ondansetron (ZOFRAN) 4 MG tablet Take 1 tablet (4 mg total) by mouth every 6 (six) hours. 12 tablet 0  . pravastatin (PRAVACHOL) 20 MG tablet Take 20 mg by mouth every evening.    . propranolol (INDERAL) 80 MG tablet Take 80 mg by mouth daily.    . sildenafil (REVATIO) 20 MG tablet Take 2 tablets (40 mg total) by mouth daily as needed. 30 tablet 11  . tacrolimus (PROGRAF) 0.5 MG capsule Take 0.5 mg by mouth 2 (two) times daily.    . predniSONE (DELTASONE) 20 MG tablet  2 tabs po for 6 days, then 1 tab po for 6 days 18 tablet 0   No facility-administered medications prior to visit.      Per HPI unless specifically indicated in ROS section below Review of Systems Objective:    BP 118/84 (BP Location: Left Arm, Patient Position: Sitting, Cuff Size: Normal)   Pulse 82   Temp 97.6 F (36.4 C) (Oral)   Ht 5' 7.5" (1.715 m)   Wt 186 lb (84.4 kg)   SpO2 95%   BMI 28.70 kg/m   Wt Readings from Last 3 Encounters:  01/31/19 186 lb (84.4 kg)  10/19/18 186 lb 4 oz (84.5 kg)  11/05/17 182 lb (82.6 kg)    Physical Exam Vitals signs and nursing note reviewed.  Constitutional:      Appearance: Normal appearance. He is not ill-appearing.  Musculoskeletal:        General: Swelling and tenderness  present.     Comments: L foot WNL R medial ankle tender erythema and swelling just proximal to ankle joint along medial malleolus No pain to palpation of sole or with calcaneal squeeze test  Skin:    General: Skin is warm.     Findings: Erythema present.  Neurological:     Mental Status: He is alert.        Lab Results  Component Value Date   LABURIC 6.1 10/12/2018    Lab Results  Component Value Date   CREATININE 1.15 10/12/2018   BUN 20 10/12/2018   NA 141 10/12/2018   K 4.8 10/12/2018   CL 106 10/12/2018   CO2 28 10/12/2018    Assessment & Plan:   Problem List Items Addressed This Visit    H/O heart transplant ([place])   Relevant Medications   predniSONE (DELTASONE) 20 MG tablet   Gout    Chronic, recent recurrent flare despite allopurinol 100mg  daily. rec increase to 150mg  once acute flare has resolved. Reviewed diet choices to help manage urate levels.       Acute right ankle pain - Primary    Anticipate acute gout flare, but not resolving with treatment to date (colchicine BID). Will add prednisone taper.  Check urate, CBC, Cr today help r/o infection as cause.  No injury.       Relevant Orders   Uric acid   Basic metabolic panel   CBC with Differential/Platelet       Meds ordered this encounter  Medications  . predniSONE (DELTASONE) 20 MG tablet    Sig: 2 tabs po for 6 days, then 1 tab po for 6 days    Dispense:  18 tablet    Refill:  0   Orders Placed This Encounter  Procedures  . Uric acid  . Basic metabolic panel  . CBC with Differential/Platelet    Follow up plan: Return if symptoms worsen or fail to improve.  Ria Bush, MD

## 2019-01-31 NOTE — Assessment & Plan Note (Signed)
Anticipate acute gout flare, but not resolving with treatment to date (colchicine BID). Will add prednisone taper.  Check urate, CBC, Cr today help r/o infection as cause.  No injury.

## 2019-01-31 NOTE — Assessment & Plan Note (Addendum)
Chronic, recent recurrent flare despite allopurinol 100mg  daily. rec increase to 150mg  once acute flare has resolved. Reviewed diet choices to help manage urate levels.

## 2019-01-31 NOTE — Patient Instructions (Addendum)
Check with transplant doctors about taking daily vitamin C 500mg .  Labs today.  Take prednisone taper sent to pharmacy.  Continue allopurinol 100mg  daily, once fully better from gout flare, let me know and I will send in allopurinol 150mg  daily. Good to see you today.

## 2019-02-01 LAB — CBC WITH DIFFERENTIAL/PLATELET
BASOS PCT: 1.3 % (ref 0.0–3.0)
Basophils Absolute: 0.1 10*3/uL (ref 0.0–0.1)
EOS ABS: 0.2 10*3/uL (ref 0.0–0.7)
EOS PCT: 3.4 % (ref 0.0–5.0)
HCT: 42.9 % (ref 39.0–52.0)
Hemoglobin: 14.2 g/dL (ref 13.0–17.0)
LYMPHS PCT: 28.9 % (ref 12.0–46.0)
Lymphs Abs: 1.8 10*3/uL (ref 0.7–4.0)
MCHC: 33.2 g/dL (ref 30.0–36.0)
MCV: 92.6 fl (ref 78.0–100.0)
Monocytes Absolute: 0.5 10*3/uL (ref 0.1–1.0)
Monocytes Relative: 8.2 % (ref 3.0–12.0)
NEUTROS ABS: 3.6 10*3/uL (ref 1.4–7.7)
NEUTROS PCT: 58.2 % (ref 43.0–77.0)
Platelets: 311 10*3/uL (ref 150.0–400.0)
RBC: 4.64 Mil/uL (ref 4.22–5.81)
RDW: 13.4 % (ref 11.5–15.5)
WBC: 6.1 10*3/uL (ref 4.0–10.5)

## 2019-02-01 LAB — BASIC METABOLIC PANEL
BUN: 15 mg/dL (ref 6–23)
CHLORIDE: 106 meq/L (ref 96–112)
CO2: 27 meq/L (ref 19–32)
CREATININE: 1.02 mg/dL (ref 0.40–1.50)
Calcium: 9.8 mg/dL (ref 8.4–10.5)
GFR: 87.07 mL/min (ref >60.00)
Glucose, Bld: 95 mg/dL (ref 70–99)
POTASSIUM: 4.3 meq/L (ref 3.5–5.1)
Sodium: 142 mEq/L (ref 135–145)

## 2019-02-01 LAB — URIC ACID: Uric Acid, Serum: 5.7 mg/dL (ref 4.0–7.8)

## 2019-02-04 ENCOUNTER — Other Ambulatory Visit: Payer: Self-pay | Admitting: Family Medicine

## 2019-02-04 MED ORDER — ALLOPURINOL 100 MG PO TABS: 150.0000 mg | ORAL_TABLET | Freq: Every day | ORAL | 1 refills | Status: DC

## 2019-02-06 ENCOUNTER — Telehealth: Payer: Self-pay | Admitting: Family Medicine

## 2019-02-06 NOTE — Telephone Encounter (Signed)
Pt need refill for   Allopurinol 150 mg for 90 day supply    Sent to [facility]

## 2019-02-06 NOTE — Telephone Encounter (Signed)
Does allopurinol come in 150 mg tab?  Or do you still want him to take 1.5 tab BID?

## 2019-02-07 MED ORDER — ALLOPURINOL 100 MG PO TABS: 150.0000 mg | ORAL_TABLET | Freq: Every day | ORAL | 1 refills | Status: DC

## 2019-02-07 NOTE — Telephone Encounter (Signed)
Sent 1.5 tab in.

## 2019-02-20 ENCOUNTER — Telehealth: Payer: Self-pay | Admitting: Family Medicine

## 2019-02-20 NOTE — Telephone Encounter (Signed)
Will you please call this pt today?

## 2019-02-20 NOTE — Telephone Encounter (Signed)
Did his gout symptoms ever fully resolve with prednisone course? The problem is anytime we increase allopurinol leading to a change in uric acid levels in the blood, that can precipitate a gout flare. So would be cautions with increasing dose while he's actively in a gout flare, rather want to change dosing when he's stable and not in pain.  Would recommend colchicine and sparing ibuprofen until fully resolved flare, then after a time of no symptoms ok to increase to 200mg  daily.

## 2019-02-20 NOTE — Telephone Encounter (Signed)
Patient did get well from the initial flare but this is a new flare in his toe.  Dr. Synthia Innocent instructions were explained and patient understood.

## 2019-02-20 NOTE — Telephone Encounter (Signed)
Patient called and said he's having gout symptoms.  Patient said Dr.G increased his Allopurinol to 150 mg and patient said he wants to increase it to 200. Patient said he's taking 2 pills instead of 1 1/2.  Patient said he thinks it would improve his symptoms.  I asked patient to wait to increase his dosage until Dr. Darnell Level is notified and okays the increase in dosage.  Please call patient.

## 2019-09-12 ENCOUNTER — Telehealth: Payer: Self-pay

## 2019-09-12 NOTE — Telephone Encounter (Signed)
Noted  

## 2019-09-12 NOTE — Telephone Encounter (Signed)
Pt said that [facility] advised ins would not pay for shingles vaccine at pharmacy but pt said he got a flu shot yesterday and [place] pd for the flu shot at the pharmacy. I spoke with [place] at [employer] and she did not know why Ins would not pay and she advised pt contact is ins co. Pt voiced understanding and will cb to schedule if ins will allow. FYI to [place] CMA.

## 2020-01-27 ENCOUNTER — Ambulatory Visit: Payer: Medicare Other | Attending: Internal Medicine

## 2020-01-27 DIAGNOSIS — Z23 Encounter for immunization: Secondary | ICD-10-CM | POA: Insufficient documentation

## 2020-01-27 NOTE — Progress Notes (Signed)
   Covid-19 Vaccination Clinic  Name:  Frederick Stephens    MRN: [id number] DOB: 12-25-1945  01/27/2020  [name] was observed post Covid-19 immunization for 15 minutes without incidence. He was provided with Vaccine Information Sheet and instruction to access the V-Safe system.   [name] was instructed to call 911 with any severe reactions post vaccine: Marland Kitchen Difficulty breathing  . Swelling of your face and throat  . A fast heartbeat  . A bad rash all over your body  . Dizziness and weakness    Immunizations Administered    Name Date Dose VIS Date Route   Pfizer COVID-19 Vaccine 01/27/2020 10:46 AM 0.3 mL 11/17/2019 Intramuscular   Manufacturer: [place]   Lot: [id number]   [place]: [id number]

## 2020-02-20 ENCOUNTER — Ambulatory Visit: Payer: Medicare Other | Attending: Internal Medicine

## 2020-02-20 DIAGNOSIS — Z23 Encounter for immunization: Secondary | ICD-10-CM

## 2020-02-20 NOTE — Progress Notes (Signed)
   Covid-19 Vaccination Clinic  Name:  Frederick Stephens    MRN: [id number] DOB: October 19, 1946  02/20/2020  [name] was observed post Covid-19 immunization for 15 minutes without incident. He was provided with Vaccine Information Sheet and instruction to access the V-Safe system.   [name] was instructed to call 911 with any severe reactions post vaccine: Marland Kitchen Difficulty breathing  . Swelling of face and throat  . A fast heartbeat  . A bad rash all over body  . Dizziness and weakness   Immunizations Administered    Name Date Dose VIS Date Route   Pfizer COVID-19 Vaccine 02/20/2020 11:05 AM 0.3 mL 11/17/2019 Intramuscular   Manufacturer: [place]   Lot: [id number]   [place]: [id number]

## 2020-03-01 ENCOUNTER — Other Ambulatory Visit: Payer: Self-pay | Admitting: Family Medicine

## 2020-03-01 NOTE — Telephone Encounter (Signed)
Sent in

## 2020-03-01 NOTE — Telephone Encounter (Signed)
Best number [phone] Pt called checking on rx  He has dentist appointment Monday am

## 2020-03-01 NOTE — Telephone Encounter (Signed)
Patient advised.

## 2020-10-03 ENCOUNTER — Emergency Department (HOSPITAL_BASED_OUTPATIENT_CLINIC_OR_DEPARTMENT_OTHER): Payer: Medicare Other

## 2020-10-03 ENCOUNTER — Other Ambulatory Visit: Payer: Self-pay

## 2020-10-03 ENCOUNTER — Encounter (HOSPITAL_COMMUNITY): Payer: Self-pay

## 2020-10-03 ENCOUNTER — Emergency Department (HOSPITAL_COMMUNITY)
Admission: EM | Admit: 2020-10-03 | Discharge: 2020-10-03 | Disposition: A | Discharge status: Home / Self Care | Payer: Medicare Other | Attending: Emergency Medicine | Admitting: Emergency Medicine

## 2020-10-03 ENCOUNTER — Telehealth: Payer: Self-pay

## 2020-10-03 ENCOUNTER — Telehealth: Payer: Self-pay | Admitting: Family Medicine

## 2020-10-03 DIAGNOSIS — Z7982 Long term (current) use of aspirin: Secondary | ICD-10-CM | POA: Diagnosis not present

## 2020-10-03 DIAGNOSIS — M79662 Pain in left lower leg: Secondary | ICD-10-CM

## 2020-10-03 DIAGNOSIS — I509 Heart failure, unspecified: Secondary | ICD-10-CM | POA: Insufficient documentation

## 2020-10-03 DIAGNOSIS — Z79899 Other long term (current) drug therapy: Secondary | ICD-10-CM | POA: Insufficient documentation

## 2020-10-03 DIAGNOSIS — I110 Hypertensive heart disease with heart failure: Secondary | ICD-10-CM | POA: Insufficient documentation

## 2020-10-03 DIAGNOSIS — Z8603 Personal history of neoplasm of uncertain behavior: Secondary | ICD-10-CM | POA: Diagnosis not present

## 2020-10-03 LAB — CBC WITH DIFFERENTIAL/PLATELET
Abs Immature Granulocytes: 0.01 10*3/uL (ref 0.00–0.07)
Basophils Absolute: 0 10*3/uL (ref 0.0–0.1)
Basophils Relative: 1 %
Eosinophils Absolute: 0.1 10*3/uL (ref 0.0–0.5)
Eosinophils Relative: 3 %
HCT: 46.3 % (ref 39.0–52.0)
Hemoglobin: 14.3 g/dL (ref 13.0–17.0)
Immature Granulocytes: 0 %
Lymphocytes Relative: 31 %
Lymphs Abs: 1.7 10*3/uL (ref 0.7–4.0)
MCH: 30.5 pg (ref 26.0–34.0)
MCHC: 30.9 g/dL (ref 30.0–36.0)
MCV: 98.7 fL (ref 80.0–100.0)
Monocytes Absolute: 0.5 10*3/uL (ref 0.1–1.0)
Monocytes Relative: 10 %
Neutro Abs: 3 10*3/uL (ref 1.7–7.7)
Neutrophils Relative %: 55 %
Platelets: 207 10*3/uL (ref 150–400)
RBC: 4.69 MIL/uL (ref 4.22–5.81)
RDW: 13.9 % (ref 11.5–15.5)
WBC: 5.3 10*3/uL (ref 4.0–10.5)
nRBC: 0 % (ref 0.0–0.2)

## 2020-10-03 LAB — BASIC METABOLIC PANEL
Anion gap: 10 (ref 5–15)
BUN: 17 mg/dL (ref 8–23)
CO2: 21 mmol/L — ABNORMAL LOW (ref 22–32)
Calcium: 9.3 mg/dL (ref 8.9–10.3)
Chloride: 109 mmol/L (ref 98–111)
Creatinine, Ser: 1.13 mg/dL (ref 0.61–1.24)
GFR, Estimated: >60 mL/min (ref >60)
Glucose, Bld: 106 mg/dL — ABNORMAL HIGH (ref 70–99)
Potassium: 4.7 mmol/L (ref 3.5–5.1)
Sodium: 140 mmol/L (ref 135–145)

## 2020-10-03 NOTE — ED Triage Notes (Signed)
Pt presents with Left calf soreness x2 weeks, noticed last night his vein was "puffed out" pt called his PCP and his Cardiologist they both suggested he come here to rule out a blood clot

## 2020-10-03 NOTE — Discharge Instructions (Signed)
As we discussed, your ultrasound today did not show any evidence of a blood clot.  Sometimes the pain that you are having can be representative of early peripheral vascular disease.  Please follow-up with your primary care doctor.  Return the emergency department for any worsening pain, redness or swelling leg, numbness/weakness or any other worsening concerning symptoms.

## 2020-10-03 NOTE — Telephone Encounter (Signed)
[facility] - Client TELEPHONE ADVICE RECORD [facility] Patient Name: Frederick Stephens Gender: Male DOB: 1946/08/14 Age: 74 Y 67 M Return Phone Number: [phone] (Primary), [phone] (Secondary) Address[place] [address] Client [facility] [facility] - Client Client Site [place] - Day Physician Ria Bush - MD Contact Type Call Who Is Calling Patient / Member / Family / Caregiver Call Type Triage / Clinical Relationship To Patient Self Return Phone Number [phone] (Primary) Chief Complaint Leg Pain Reason for Call Symptomatic / Request for Health Information Initial Comment caller states he has a blood clot in his leg, the office wants [name] to triage him, he is a heart transplant pt from 2009, he has a vein in his leg that is pronounced and he is concerned Translation No Nurse Assessment Nurse: Chestine Spore, RN, [place] Date/Time (Eastern Time): 10/03/2020 8:32:34 AM Confirm and document reason for call. If symptomatic, describe symptoms. ---caller states having a blood clot in leg. left. swollen. vein is much more swollen in calf Does the patient have any new or worsening symptoms? ---Yes Will a triage be completed? ---Yes Related visit to physician within the last 2 weeks? ---No Does the PT have any chronic conditions? (i.e. diabetes, asthma, this includes High risk factors for pregnancy, etc.) ---Yes List chronic conditions. ---HX heart transplant Is this a behavioral health or substance abuse call? ---No Guidelines Guideline Title Affirmed Question Affirmed Notes Nurse Date/Time Eilene Ghazi Time) Leg Pain [1] Thigh or calf pain AND [2] only 1 side AND [3] present > 1 hour (Exception: chronic unchanged pain) Matherly, RN, Kimberley 10/03/2020 8:34:32 AM Disp. Time Eilene Ghazi Time) Disposition Final User 10/03/2020 8:36:59 AM See HCP within 4 Hours (or PCP triage) Yes  Matherly, RN, Kimberley PLEASE NOTE: All timestamps contained within this report are represented as [place] Standard Time. CONFIDENTIALTY NOTICE: This fax transmission is intended only for the addressee. It contains information that is legally privileged, confidential or otherwise protected from use or disclosure. If you are not the intended recipient, you are strictly prohibited from reviewing, disclosing, copying using or disseminating any of this information or taking any action in reliance on or regarding this information. If you have received this fax in error, please notify us immediately by telephone so that we can arrange for its return to [name]. Phone: [phone], Toll-Free: [phone], Fax: [phone] Page: 2 of 2 Call Id: [id number] [place] Disagree/Comply Comply [name] Understands Yes PreDisposition Call Doctor Care Advice Given Per Guideline SEE HCP (OR PCP TRIAGE) WITHIN 4 HOURS: CALL EMS IF: * You develop any chest pain or shortness of breath. CARE ADVICE given per Leg Pain (Adult) guideline. Referrals GO TO FACILITY UNDECIDED

## 2020-10-03 NOTE — Telephone Encounter (Signed)
I spoke with pt's wife and pt Is presently waiting to hear back from card. Pt will go to UC if needed. Pt called back and said he just arrived at [facility] ED for eval. Lt lower leg is sore and swollen but no redness. Pt is going in now to [facility] ED. No CP or SOB at this time. FYI to Dr Danise Mina.

## 2020-10-03 NOTE — ED Notes (Signed)
Patient verbalizes understanding of discharge instructions. Opportunity for questioning and answers were provided. Armband removed by staff, pt discharged from ED and ambulated to lobby to return home.   

## 2020-10-03 NOTE — Progress Notes (Signed)
VASCULAR LAB    Left lower extremity venous duplex has been performed.  See CV proc for preliminary results.  Messaged Providence Lanius, PA-C, with results.   Vernee Baines, RVT 10/03/2020, 12:33 PM

## 2020-10-03 NOTE — ED Provider Notes (Signed)
[facility] EMERGENCY DEPARTMENT Provider Note   CSN: [id number] Arrival date & time: 10/03/20  1027     History Chief Complaint  Patient presents with  . Leg Pain    Frederick Stephens is a 74 y.o. male past medical history of CHF, heart transplant (currently on Prograf), gout, hypertension presents today for concerns of left calf soreness x2 weeks.  He states he started noticing about 2 weeks ago that his left calf would be sore and aching with walking.  He states that when he would rest, it would get better.  He described as a constant achy or soreness that was a 4/10.  He said he felt like the superficial veins of his leg were more pronounced and swollen.  He had not noted any overlying warmth, erythema.  He called his primary care doctor and his cardiologist who recommended he come to the emergency department to rule out a blood clot.  Patient does report that he does sit around and watch reports a lot.  He has never had any previous DVT or PE and is currently not on any blood thinners. He denies any exogenous hormone use, recent immobilization, prior history of DVT/PE, recent surgery, leg swelling, or long travel.  He denies any chest pain, difficulty breathing, numbness/weakness in his legs, fevers.   The history is provided by the patient.       Past Medical History:  Diagnosis Date  . CAP (community acquired pneumonia) 01/03/2014  . CHF (congestive heart failure) ([place])    ?HTN related  . 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 Elevated PSA 06/21/2016   40s - s/p benign biopsy 2017 followed closely by Dr Karsten Ro urology   . Gout   . History of chicken pox   . History of shingles 2012  . HTN (hypertension)   . OSA on CPAP   . Osteoporosis 2015   per [facility] records by [facility] 2015, on fosamax  . S/P orthotopic heart transplant ([place]) 11/29/08   due to heart failure,cardiomyopathy    Patient Active Problem List   Diagnosis Date Noted  . Acute right  ankle pain 01/31/2019  . 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 HLD (hyperlipidemia) 10/09/2018  . Erectile dysfunction 11/05/2017  . Swelling of right index finger 06/07/2017  . Vitamin D deficiency 08/21/2016  . Right nephrolithiasis 07/07/2016  . BPH (benign prostatic hyperplasia) 06/21/2016  . Anal itching 08/30/2015  . Hypomagnesemia 06/11/2015  . Osteoporosis   . OSA on CPAP   . Medicare annual wellness visit, subsequent 01/15/2015  . Advanced care planning/counseling discussion 01/15/2015  . Health maintenance examination 01/15/2015  . Hearing loss due to cerumen impaction 01/15/2015  . Essential tremor 10/19/2013  . HTN (hypertension)   . Gout   . CHF (congestive heart failure) ([place])   . H/O heart transplant ([place]) 11/29/2008    Past Surgical History:  Procedure Laterality Date  . COLONOSCOPY  10/2014   [facility])  . dobutamine stress test  11/2014   normal  . ESOPHAGOGASTRODUODENOSCOPY  07/2018   soft palate polyp (rec f/u ENT), esophageal strictures x2 s/p dilatation, HH ([place])  . HEART TRANSPLANT  11/29/08   [facility] (Dr Thalia Party IV)       Family History  Problem Relation Age of Onset  . Gout Mother   . Hypertension Mother   . Heart disease Mother   . Gout Brother   .  Hypertension Brother   . Heart disease Father   . Diabetes Brother   . Cancer Neg Hx   . Stroke Neg Hx     Social History   Tobacco Use  . Smoking status: Never Smoker  . Smokeless tobacco: Never Used  Substance Use Topics  . Alcohol use: No  . Drug use: No    Home Medications Prior to Admission medications   Medication Sig Start Date End Date Taking? Authorizing Provider  alendronate (FOSAMAX) 70 MG tablet Take 70 mg by mouth every Friday. Take with a full glass of water on an empty stomach.     [provider]  allopurinol (ZYLOPRIM) 100 MG tablet Take 1.5 tablets (150 mg total) by mouth daily. 02/07/19    Ria Bush, MD  amLODipine (NORVASC) 2.5 MG tablet Take 2.5 mg by mouth 2 (two) times daily.    [provider]  amoxicillin (AMOXIL) 500 MG capsule TAKE 4 CAPSULES BY MOUTH DAILY AS NEEDED (FOR DENTAL PROCEDURES). REPORTED ON 04/14/2016 03/01/20   Ria Bush, MD  aspirin 81 MG tablet Take 81 mg by mouth daily.    [provider]  Calcium Carb-Cholecalciferol (CALCIUM-VITAMIN D) 600-400 MG-UNIT TABS Take 1 tablet by mouth daily.    [provider]  cholecalciferol (VITAMIN D) 1000 units tablet Take 1,000 Units by mouth daily.    [provider]  COLCRYS 0.6 MG tablet TAKE 1 TABLET (0.6 MG TOTAL) BY MOUTH 2 (TWO) TIMES DAILY. AS NEEDED GOUT FLARE 12/05/18   Ria Bush, MD  HYDROcodone-acetaminophen (NORCO/VICODIN) 5-325 MG tablet Take 1 tablet by mouth every 6 (six) hours as needed for moderate pain. 10/07/17   Ria Bush, MD  hydrocortisone (ANUSOL-HC) 2.5 % rectal cream Place 1 application rectally daily as needed.    [provider]  ibuprofen (ADVIL,MOTRIN) 600 MG tablet Take 1 tablet (600 mg total) by mouth every 6 (six) hours as needed. 07/04/16   Vira Blanco, MD  Magnesium 400 MG TABS Take 1 tablet by mouth 3 (three) times daily.    [provider]  mycophenolate (CELLCEPT) 250 MG capsule Take 500 mg by mouth 2 (two) times daily. 06/03/15   [provider]  ondansetron (ZOFRAN) 4 MG tablet Take 1 tablet (4 mg total) by mouth every 6 (six) hours. 07/04/16   Vira Blanco, MD  pravastatin (PRAVACHOL) 20 MG tablet Take 20 mg by mouth every evening.    [provider]  predniSONE (DELTASONE) 20 MG tablet 2 tabs po for 6 days, then 1 tab po for 6 days 01/31/19   Ria Bush, MD  propranolol (INDERAL) 80 MG tablet Take 80 mg by mouth daily.    [provider]  sildenafil (REVATIO) 20 MG tablet Take 2 tablets (40 mg total) by mouth daily as needed. 10/19/18   Ria Bush, MD   tacrolimus (PROGRAF) 0.5 MG capsule Take 0.5 mg by mouth 2 (two) times daily.    [provider]    Allergies    Aldactone [spironolactone] and Amiodarone  Review of Systems   Review of Systems  Constitutional: Negative for fever.  Respiratory: Negative for cough and shortness of breath.   Cardiovascular: Positive for leg swelling. Negative for chest pain.  Gastrointestinal: Negative for abdominal pain, nausea and vomiting.  Musculoskeletal:       Left calf pain  Skin: Negative for color change.  Neurological: Negative for weakness, numbness and headaches.  All other systems reviewed and are negative.   Physical Exam Updated  Vital Signs BP (!) 122/92   Pulse 72   Temp 98 F (36.7 C) (Oral)   Resp 17   Ht 5\' 9"  (1.753 m)   Wt 86.2 kg   SpO2 99%   BMI 28.06 kg/m   Physical Exam Vitals and nursing note reviewed.  Constitutional:      Appearance: Normal appearance. He is well-developed.  HENT:     Head: Normocephalic and atraumatic.  Eyes:     General: Lids are normal.     Conjunctiva/sclera: Conjunctivae normal.     Pupils: Pupils are equal, round, and reactive to light.  Cardiovascular:     Rate and Rhythm: Normal rate and regular rhythm.     Pulses: Normal pulses.          Radial pulses are 2+ on the right side and 2+ on the left side.       Dorsalis pedis pulses are 2+ on the right side and 2+ on the left side.     Heart sounds: Normal heart sounds. No murmur heard.  No friction rub. No gallop.   Pulmonary:     Effort: Pulmonary effort is normal.     Breath sounds: Normal breath sounds.     Comments: Lungs clear to auscultation bilaterally.  Symmetric chest rise.  No wheezing, rales, rhonchi.  Abdominal:     Palpations: Abdomen is soft. Abdomen is not rigid.     Tenderness: There is no abdominal tenderness. There is no guarding.  Musculoskeletal:        General: Normal range of motion.     Cervical back: Full passive range of motion without pain.      Comments: Mild tenderness noted to the left calf.  No bony tenderness in left tib-fib area.  No obvious swelling, warmth, erythema, edema.  Skin:    General: Skin is warm and dry.     Capillary Refill: Capillary refill takes less than 2 seconds.     Comments: Good distal cap refill.  LLE is not dusky in appearance or cool to touch.  Neurological:     Mental Status: He is alert and oriented to person, place, and time.     Comments: Sensation intact along major nerve distributions of BLE  Psychiatric:        Speech: Speech normal.     ED Results / Procedures / Treatments   Labs (all labs ordered are listed, but only abnormal results are displayed) Labs Reviewed  CBC WITH DIFFERENTIAL/PLATELET  BASIC METABOLIC PANEL    EKG None  Radiology VAS [name] LOWER EXTREMITY VENOUS (DVT) ([facility] and [facility] 7a-7p)  Result Date: 10/03/2020  Lower Venous DVT Study Indications: Calf pain, "puffy veins" noted lateral calf.  Comparison Study: No prior study on file Performing Technologist: Sharion Dove RVS  Examination Guidelines: A complete evaluation includes B-mode imaging, spectral Doppler, color Doppler, and power Doppler as needed of all accessible portions of each vessel. Bilateral testing is considered an integral part of a complete examination. Limited examinations for reoccurring indications may be performed as noted. The reflux portion of the exam is performed with the patient in reverse Trendelenburg.  +-----+---------------+---------+-----------+----------+--------------+ RIGHTCompressibilityPhasicitySpontaneityPropertiesThrombus Aging +-----+---------------+---------+-----------+----------+--------------+ CFV  Full           Yes      Yes                                 +-----+---------------+---------+-----------+----------+--------------+   +---------+---------------+---------+-----------+----------+--------------+ LEFT  CompressibilityPhasicitySpontaneityPropertiesThrombus  Aging +---------+---------------+---------+-----------+----------+--------------+ CFV      Full           Yes      Yes                                 +---------+---------------+---------+-----------+----------+--------------+ SFJ      Full                                                        +---------+---------------+---------+-----------+----------+--------------+ FV Prox  Full                                                        +---------+---------------+---------+-----------+----------+--------------+ FV Mid   Full                                                        +---------+---------------+---------+-----------+----------+--------------+ FV DistalFull                                                        +---------+---------------+---------+-----------+----------+--------------+ PFV      Full                                                        +---------+---------------+---------+-----------+----------+--------------+ POP      Full           Yes      Yes                                 +---------+---------------+---------+-----------+----------+--------------+ PTV      Full                                                        +---------+---------------+---------+-----------+----------+--------------+ PERO     Full                                                        +---------+---------------+---------+-----------+----------+--------------+ Gastroc  Full                                                        +---------+---------------+---------+-----------+----------+--------------+  GSV      Full                                                        +---------+---------------+---------+-----------+----------+--------------+ SSV      Full                                                        +---------+---------------+---------+-----------+----------+--------------+     Summary: RIGHT: - No evidence of  common femoral vein obstruction.  LEFT: - There is no evidence of deep vein thrombosis in the lower extremity. - There is no evidence of superficial venous thrombosis.  Varicosities noted lateral calf, non thrombosed.  *See table(s) above for measurements and observations.    Preliminary     Procedures Procedures (including critical care time)  Medications Ordered in ED Medications - No data to display  ED Course  I have reviewed the triage vital signs and the nursing notes.  Pertinent labs & imaging results that were available during my care of the patient were reviewed by me and considered in my medical decision making (see chart for details).    MDM Rules/Calculators/A&P                          74 year old male who presents for evaluation of left calf soreness x2 weeks.  Sent by PCP and cardiologist to rule out blood clot.  No prior history of DVT or PE.  Does report that he sits around and watches sports a lot.  States he has not had any overlying warmth, erythema.  States mostly the achiness or soreness in his left calf occurs with walking.  On initially arrival, he is afebrile nontoxic-appearing.  Vital signs are stable.  On exam, he has equal pulses, good cap refill.  Question of this is claudication given history of pain with movement.  He does not have any overlying warmth or erythema.  Low suspicion for DVT but is a consideration.  History/physical exam concerning for ischemic limb, septic arthritis. Doubt cellulitis. We will plan for imaging for evaluation of DVT.  CBC unremarkable.   Ultrasound negative for DVT, superficial thrombophlebitis.  Discussed results with patient.  At this time, his exam is not concerning for infectious etiology as it has been getting worse, erythema, edema.  Encouraged at home supportive care measures.  Discussed when to follow-up with his primary care doctors and could be claudication. At this time, patient exhibits no emergent life-threatening  condition that require further evaluation in ED. Patient had ample opportunity for questions and discussion. All patient's questions were answered with full understanding. Strict return precautions discussed. Patient expresses understanding and agreement to plan.   Portions of this note were generated with [employer]. Dictation errors may occur despite best attempts at proofreading.  Final Clinical Impression(s) / ED Diagnoses Final diagnoses:  Pain of left calf    Rx / DC Orders ED Discharge Orders    None       Volanda Napoleon, PA-C 10/03/20 1324    [place], Adam, DO 10/03/20 1356

## 2020-10-03 NOTE — Telephone Encounter (Signed)
Called and left vm for the patient . Need to reschedule 11/2 visit due to DR Meeting

## 2020-10-03 NOTE — Telephone Encounter (Signed)
Noted. Will await evaluation.

## 2020-10-08 ENCOUNTER — Ambulatory Visit (INDEPENDENT_AMBULATORY_CARE_PROVIDER_SITE_OTHER): Payer: Medicare Other | Admitting: Family Medicine

## 2020-10-08 ENCOUNTER — Encounter: Payer: Self-pay | Admitting: Family Medicine

## 2020-10-08 ENCOUNTER — Other Ambulatory Visit: Payer: Self-pay

## 2020-10-08 VITALS — BP 118/80 | HR 76 | Temp 97.7°F | Ht 69.0 in | Wt 196.4 lb

## 2020-10-08 DIAGNOSIS — M79662 Pain in left lower leg: Secondary | ICD-10-CM

## 2020-10-08 DIAGNOSIS — Z941 Heart transplant status: Secondary | ICD-10-CM

## 2020-10-08 NOTE — Assessment & Plan Note (Signed)
Reassuring exam, as well as recent ER workup with negative venous US for DVT. Story /exam most consistent with MSK cause like muscle strain - rec trial topical voltaren to affected area BID x 1 wk, update with effect. With exertional calf pain, reasonable to check ABIs r/o arterial insufficiency. Will order.

## 2020-10-08 NOTE — Progress Notes (Signed)
This visit was conducted in person.  BP 118/80 (BP Location: Left Arm, Patient Position: Sitting, Cuff Size: Normal)   Pulse 76   Temp 97.7 F (36.5 C) (Temporal)   Ht 5\' 9"  (1.753 m)   Wt 196 lb 7 oz (89.1 kg)   SpO2 98%   BMI 29.01 kg/m    CC: ER f/u visit  Subjective:    Patient ID: Frederick Stephens, male    DOB: 1946/10/31, 74 y.o.   MRN: [id number]  HPI: Frederick Stephens is a 74 y.o. male presenting on 10/08/2020 for Hospitalization Follow-up (Seen at [facility] ED on 10/03/20.)   Seen at ER on 10/03/2020 with 2 wk h/o L calf discomfort noted with walking. Workup included normal CBC/BMP and reassuring venous ultrasound with no evidence of DVT but non-thrombosed varicosities noted to L lateral calf.   Describes 3-4 wk h/o L leg soreness most noticeable with walking.   Increased sedentary lifestyle since covid.  He is only on aspirin 81mg  daily. He also continues pravastatin 20mg  daily.   S/p heart transplant 2009. Cardiologist at [facility] is Dr Davina Poke.  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 He also continues amlodipine 2.5mg  twice daily, mycophenolate and tacrolimus.  Planned catheterization 12/2019 (gets q2 yrs).   Alendronate on hold due to [facility].      Relevant past medical, surgical, family and social history reviewed and updated as indicated. Interim medical history since our last visit reviewed. Allergies and medications reviewed and updated. Outpatient Medications Prior to Visit  Medication Sig Dispense Refill  . alendronate (FOSAMAX) 70 MG tablet Take 70 mg by mouth every Friday. Take with a full glass of water on an empty stomach. As needed    . allopurinol (ZYLOPRIM) 100 MG tablet Take 1.5 tablets (150 mg total) by mouth daily. 135 tablet 1  . amLODipine (NORVASC) 2.5 MG tablet Take 2.5 mg by mouth 2 (two) times daily.    Marland Kitchen amoxicillin (AMOXIL) 500 MG capsule TAKE 4 CAPSULES BY MOUTH DAILY AS NEEDED (FOR  DENTAL PROCEDURES). REPORTED ON 04/14/2016 4 capsule 3  . aspirin 81 MG tablet Take 81 mg by mouth daily.    . Calcium Carb-Cholecalciferol (CALCIUM-VITAMIN D) 600-400 MG-UNIT TABS Take 1 tablet by mouth daily.    . cholecalciferol (VITAMIN D) 1000 units tablet Take 1,000 Units by mouth daily.    Marland Kitchen COLCRYS 0.6 MG tablet TAKE 1 TABLET (0.6 MG TOTAL) BY MOUTH 2 (TWO) TIMES DAILY. AS NEEDED GOUT FLARE 30 tablet 2  . HYDROcodone-acetaminophen (NORCO/VICODIN) 5-325 MG tablet Take 1 tablet by mouth every 6 (six) hours as needed for moderate pain. 15 tablet 0  . hydrocortisone (ANUSOL-HC) 2.5 % rectal cream Place 1 application rectally daily as needed.    Marland Kitchen ibuprofen (ADVIL,MOTRIN) 600 MG tablet Take 1 tablet (600 mg total) by mouth every 6 (six) hours as needed. 30 tablet 0  . Magnesium 400 MG TABS Take 1 tablet by mouth 3 (three) times daily.    . mycophenolate (CELLCEPT) 250 MG capsule Take 500 mg by mouth 2 (two) times daily.    . ondansetron (ZOFRAN) 4 MG tablet Take 1 tablet (4 mg total) by mouth every 6 (six) hours. (Patient taking differently: Take 4 mg by mouth every 6 (six) hours. As needed) 12 tablet 0  . pravastatin (PRAVACHOL) 20 MG tablet Take 20 mg by mouth every evening.    . propranolol (INDERAL) 80 MG tablet Take  80 mg by mouth daily.    . sildenafil (REVATIO) 20 MG tablet Take 2 tablets (40 mg total) by mouth daily as needed. 30 tablet 11  . tacrolimus (PROGRAF) 0.5 MG capsule Take 0.5 mg by mouth 2 (two) times daily.    . predniSONE (DELTASONE) 20 MG tablet 2 tabs po for 6 days, then 1 tab po for 6 days 18 tablet 0   No facility-administered medications prior to visit.     Per HPI unless specifically indicated in ROS section below Review of Systems Objective:  BP 118/80 (BP Location: Left Arm, Patient Position: Sitting, Cuff Size: Normal)   Pulse 76   Temp 97.7 F (36.5 C) (Temporal)   Ht 5\' 9"  (1.753 m)   Wt 196 lb 7 oz (89.1 kg)   SpO2 98%   BMI 29.01 kg/m   Wt Readings  from Last 3 Encounters:  10/08/20 196 lb 7 oz (89.1 kg)  10/03/20 190 lb (86.2 kg)  01/31/19 186 lb (84.4 kg)      Physical Exam Vitals and nursing note reviewed.  Constitutional:      Appearance: Normal appearance. He is not ill-appearing.  Cardiovascular:     Rate and Rhythm: Normal rate and regular rhythm.     Pulses: Normal pulses.     Heart sounds: Normal heart sounds. No murmur heard.   Pulmonary:     Effort: Pulmonary effort is normal. No respiratory distress.     Breath sounds: Normal breath sounds. No wheezing, rhonchi or rales.  Musculoskeletal:        General: Normal range of motion.     Right lower leg: No edema.     Left lower leg: No edema.     Comments:  1+ DP/PT bilaterally Mild discomfort to palpation medial calf on left without palpable cords  Skin:    General: Skin is warm and dry.     Capillary Refill: Capillary refill takes less than 2 seconds.     Findings: No erythema or rash.  Neurological:     Mental Status: He is alert.  Psychiatric:        Mood and Affect: Mood normal.        Behavior: Behavior normal.       Results for orders placed or performed during the hospital encounter of 10/03/20  CBC with Differential  Result Value Ref Range   WBC 5.3 4.0 - 10.5 K/uL   RBC 4.69 4.22 - 5.81 MIL/uL   Hemoglobin 14.3 13.0 - 17.0 g/dL   HCT 46.3 39 - 52 %   MCV 98.7 80.0 - 100.0 fL   MCH 30.5 26.0 - 34.0 pg   MCHC 30.9 30.0 - 36.0 g/dL   RDW 13.9 11.5 - 15.5 %   Platelets 207 150 - 400 K/uL   nRBC 0.0 0.0 - 0.2 %   Neutrophils Relative % 55 %   Neutro Abs 3.0 1.7 - 7.7 K/uL   Lymphocytes Relative 31 %   Lymphs Abs 1.7 0.7 - 4.0 K/uL   Monocytes Relative 10 %   Monocytes Absolute 0.5 0.1 - 1.0 K/uL   Eosinophils Relative 3 %   Eosinophils Absolute 0.1 0.0 - 0.5 K/uL   Basophils Relative 1 %   Basophils Absolute 0.0 0.0 - 0.1 K/uL   Immature Granulocytes 0 %   Abs Immature Granulocytes 0.01 0.00 - 0.07 K/uL  Basic metabolic panel  Result  Value Ref Range   Sodium 140 135 - 145 mmol/L  Potassium 4.7 3.5 - 5.1 mmol/L   Chloride 109 98 - 111 mmol/L   CO2 21 (L) 22 - 32 mmol/L   Glucose, Bld 106 (H) 70 - 99 mg/dL   BUN 17 8 - 23 mg/dL   Creatinine, Ser 1.13 0.61 - 1.24 mg/dL   Calcium 9.3 8.9 - 10.3 mg/dL   GFR, Estimated >60 >60 mL/min   Anion gap 10 5 - 15   Assessment & Plan:  This visit occurred during the SARS-CoV-2 public health emergency.  Safety protocols were in place, including screening questions prior to the visit, additional usage of staff PPE, and extensive cleaning of exam room while observing appropriate contact time as indicated for disinfecting solutions.   Problem List Items Addressed This Visit    Pain of left calf - Primary    Reassuring exam, as well as recent ER workup with negative venous US for DVT. Story /exam most consistent with MSK cause like muscle strain - rec trial topical voltaren to affected area BID x 1 wk, update with effect. With exertional calf pain, reasonable to check ABIs r/o arterial insufficiency. Will order.       Relevant Orders   VAS [name] LOWER EXT ART SEG MULTI (SEGMENTALS & LE RAYNAUDS)   H/O heart transplant ([place])       No orders of the defined types were placed in this encounter.  No orders of the defined types were placed in this encounter.   Patient Instructions  I think leg pain may be coming from left calf muscle (muscular discomfort) but I do recommend we check arterial circulation evaluation with ankle-brachial index test. May take voltaren (diclofenac) gel over the counter topically twice daily to tender area for a week.  Let us know if not improving with this.    Follow up plan: Return if symptoms worsen or fail to improve.  Ria Bush, MD

## 2020-10-08 NOTE — Patient Instructions (Addendum)
I think leg pain may be coming from left calf muscle (muscular discomfort) but I do recommend we check arterial circulation evaluation with ankle-brachial index test. May take voltaren (diclofenac) gel over the counter topically twice daily to tender area for a week.  Let us know if not improving with this.

## 2020-10-15 ENCOUNTER — Other Ambulatory Visit: Payer: Self-pay | Admitting: Family Medicine

## 2020-10-15 DIAGNOSIS — I739 Peripheral vascular disease, unspecified: Secondary | ICD-10-CM

## 2020-10-16 ENCOUNTER — Ambulatory Visit (HOSPITAL_COMMUNITY)
Admission: RE | Admit: 2020-10-16 | Discharge: 2020-10-16 | Disposition: A | Discharge status: Home / Self Care | Payer: Medicare Other | Source: Ambulatory Visit | Attending: Internal Medicine | Admitting: Internal Medicine

## 2020-10-16 DIAGNOSIS — I739 Peripheral vascular disease, unspecified: Secondary | ICD-10-CM | POA: Diagnosis not present

## 2020-12-07 HISTORY — PX: CARDIAC CATHETERIZATION: SHX172

## 2020-12-09 ENCOUNTER — Other Ambulatory Visit: Payer: Self-pay

## 2020-12-09 ENCOUNTER — Other Ambulatory Visit: Payer: Medicare Other

## 2020-12-09 DIAGNOSIS — Z20822 Contact with and (suspected) exposure to covid-19: Secondary | ICD-10-CM

## 2020-12-10 LAB — NOVEL CORONAVIRUS, NAA: SARS-CoV-2, NAA: NOT DETECTED

## 2020-12-10 LAB — SARS-COV-2, NAA 2 DAY TAT

## 2020-12-12 ENCOUNTER — Encounter: Payer: Self-pay | Admitting: Family Medicine

## 2021-01-10 ENCOUNTER — Encounter: Payer: Self-pay | Admitting: Family Medicine

## 2021-03-27 ENCOUNTER — Telehealth: Payer: Self-pay

## 2021-03-27 MED ORDER — AMOXICILLIN 500 MG PO CAPS: ORAL_CAPSULE | ORAL | 3 refills | Status: DC

## 2021-03-27 NOTE — Telephone Encounter (Signed)
Rx sent 

## 2021-03-27 NOTE — Telephone Encounter (Signed)
Pt called needing his pre-dental procedure amoxicillin sent to [facility]. He will be having dental work in May.

## 2021-03-27 NOTE — Telephone Encounter (Signed)
Noted  

## 2022-05-05 ENCOUNTER — Other Ambulatory Visit: Payer: Self-pay | Admitting: Family Medicine

## 2022-07-10 ENCOUNTER — Ambulatory Visit (INDEPENDENT_AMBULATORY_CARE_PROVIDER_SITE_OTHER): Payer: Medicare Other | Admitting: Nurse Practitioner

## 2022-07-10 VITALS — BP 112/74 | HR 75 | Temp 97.1°F | Resp 12 | Ht 69.0 in | Wt 189.0 lb

## 2022-07-10 DIAGNOSIS — H6122 Impacted cerumen, left ear: Secondary | ICD-10-CM

## 2022-07-10 DIAGNOSIS — H00011 Hordeolum externum right upper eyelid: Secondary | ICD-10-CM | POA: Diagnosis not present

## 2022-07-10 DIAGNOSIS — H9202 Otalgia, left ear: Secondary | ICD-10-CM | POA: Diagnosis not present

## 2022-07-10 MED ORDER — ERYTHROMYCIN 5 MG/GM OP OINT: 1.0000 | TOPICAL_OINTMENT | Freq: Three times a day (TID) | OPHTHALMIC | 0 refills | Status: AC

## 2022-07-10 NOTE — Assessment & Plan Note (Signed)
Exam consistent with hordeolum of the right upper eyelid.  Given patient is a heart transplant patient on immune suppressing medications will like to treat with erythromycin 0.5% ophthalmic ointment 3 times daily right eye.  Patient was given strict signs and symptoms when to be seen again or to reach out to healthcare professional.  Patient acknowledged

## 2022-07-10 NOTE — Patient Instructions (Signed)
Nice to see you today I sent in the eye ointment to the pharmacy it is called Erythromycin if you want to call your team at [facility]. If the eye gets more swollen, redness spreads or you start running a fever let us know if go be seen again.  Follow up if no improvement or symptoms worsen   You can use warm compresses on the eye to help

## 2022-07-10 NOTE — Assessment & Plan Note (Signed)
Likely secondary to cerumen impaction.  Patient states he felt better post disimpaction.  Follow-up if no improvement

## 2022-07-10 NOTE — Assessment & Plan Note (Signed)
Verbal consent obtained.  Patient was prepped per office policy a mixture of water and hydroperoxide was used and ears were irrigated.  Patient tolerated procedure well impaction removed TM within normal limits post procedure.

## 2022-07-10 NOTE — Progress Notes (Signed)
Acute Office Visit  Subjective:     Patient ID: Frederick Stephens, male    DOB: December 23, 1945, 76 y.o.   MRN: [id number]  Chief Complaint  Patient presents with   Stye    On left top eye lid. Came up on 07/08/22-itching is present but no pain. No fever.   Ear Pain    Started with left ear pain and has used OTC ear wax drops x several months. Sometimes right ear hurts. Decreased hearing.     HPI Patient is in today for Multiple complaints  Stye: Left sided that approx 2 days ago. States that it does itch. States that he is a little blurry on that side. No eye discharge, reddness, or eye pain   Ear pain: States that his left ear has been bothering him for approx 6 months. States that he has been using over the counter wax drops and cleaning it. States that the left ear hurts sometimes and he feels like a decrease in hearing   Review of Systems  Constitutional:  Negative for chills and fever.  HENT:  Positive for ear pain and hearing loss. Negative for congestion, ear discharge and sore throat.   Eyes:  Negative for pain, discharge and redness.  Respiratory:  Negative for shortness of breath.   Cardiovascular:  Negative for chest pain.        Objective:    BP 112/74   Pulse 75   Temp (!) 97.1 F (36.2 C)   Resp 12   Ht '5\' 9"'$  (1.753 m)   Wt 189 lb (85.7 kg)   SpO2 97%   BMI 27.91 kg/m    Physical Exam Vitals and nursing note reviewed.  Constitutional:      Appearance: Normal appearance.  HENT:     Right Ear: Tympanic membrane, ear canal and external ear normal. There is no impacted cerumen.     Left Ear: Ear canal and external ear normal. There is impacted cerumen.  Eyes:     Extraocular Movements: Extraocular movements intact.     Pupils: Pupils are equal, round, and reactive to light.      Comments: Erythema and edema to right upper lid.  Some purulent discharge in the inner canthus  Cardiovascular:     Rate and Rhythm: Normal rate and regular rhythm.     Heart  sounds: Normal heart sounds.  Pulmonary:     Breath sounds: Normal breath sounds.  Neurological:     Mental Status: He is alert.     No results found for any visits on 07/10/22.      Assessment & Plan:   Problem List Items Addressed This Visit       Nervous and Auditory   Hearing loss due to cerumen impaction    Verbal consent obtained.  Patient was prepped per office policy a mixture of water and hydroperoxide was used and ears were irrigated.  Patient tolerated procedure well impaction removed TM within normal limits post procedure.      Relevant Orders   Ear Lavage     Other   Otalgia, left    Likely secondary to cerumen impaction.  Patient states he felt better post disimpaction.  Follow-up if no improvement      Hordeolum externum of right upper eyelid - Primary    Exam consistent with hordeolum of the right upper eyelid.  Given patient is a heart transplant patient on immune suppressing medications will like to treat with erythromycin 0.5% ophthalmic ointment 3  times daily right eye.  Patient was given strict signs and symptoms when to be seen again or to reach out to healthcare professional.  Patient acknowledged      Relevant Medications   erythromycin ophthalmic ointment    Meds ordered this encounter  Medications   erythromycin ophthalmic ointment    Sig: Place 1 Application into the right eye 3 (three) times daily for 7 days.    Dispense:  3.5 g    Refill:  0    Order Specific Question:   Supervising Provider    Answer:   Loura Pardon A [[id number]]    Return if symptoms worsen or fail to improve.  Romilda Garret, NP

## 2022-07-13 ENCOUNTER — Telehealth: Payer: Self-pay | Admitting: Family Medicine

## 2022-07-13 NOTE — Telephone Encounter (Signed)
If his eye has cleared up he does not have to start the eye ointment. I do not think him vomiting is related. If it is something that continues or worries him he can follow up with Dr. Darnell Level.

## 2022-07-13 NOTE — Telephone Encounter (Signed)
Patient called in with questions regarding erythromycin ophthalmic ointment. He was seen by Romilda Garret on Friday. He hasn't started using it yet because his condition cleared up Saturday. He was wanting to know if he should still start the medicine. Stated he was vomiting Sunday and not sure if its from the condition. Please advise. Thank you!

## 2022-07-14 NOTE — Telephone Encounter (Signed)
Patient advised. Patient states he is feeling much better today.

## 2022-09-25 ENCOUNTER — Ambulatory Visit (INDEPENDENT_AMBULATORY_CARE_PROVIDER_SITE_OTHER): Payer: Medicare Other | Admitting: Family Medicine

## 2022-09-25 ENCOUNTER — Encounter: Payer: Self-pay | Admitting: Family Medicine

## 2022-09-25 VITALS — BP 114/70 | HR 87 | Temp 98.2°F | Resp 16 | Ht 69.0 in | Wt 189.2 lb

## 2022-09-25 DIAGNOSIS — R197 Diarrhea, unspecified: Secondary | ICD-10-CM | POA: Diagnosis not present

## 2022-09-25 LAB — COMPREHENSIVE METABOLIC PANEL
AG Ratio: 1.6 (calc) (ref 1.0–2.5)
ALT: 8 U/L — ABNORMAL LOW (ref 9–46)
AST: 12 U/L (ref 10–35)
Albumin: 4.4 g/dL (ref 3.6–5.1)
Alkaline phosphatase (APISO): 62 U/L (ref 35–144)
BUN: 13 mg/dL (ref 7–25)
CO2: 28 mmol/L (ref 20–32)
Calcium: 9.8 mg/dL (ref 8.6–10.3)
Chloride: 108 mmol/L (ref 98–110)
Creat: 0.9 mg/dL (ref 0.70–1.28)
Globulin: 2.8 g/dL (calc) (ref 1.9–3.7)
Glucose, Bld: 95 mg/dL (ref 65–99)
Potassium: 4 mmol/L (ref 3.5–5.3)
Sodium: 143 mmol/L (ref 135–146)
Total Bilirubin: 0.7 mg/dL (ref 0.2–1.2)
Total Protein: 7.2 g/dL (ref 6.1–8.1)

## 2022-09-25 LAB — CBC WITH DIFFERENTIAL/PLATELET
Absolute Monocytes: 531 cells/uL (ref 200–950)
Basophils Absolute: 13 cells/uL (ref 0–200)
Basophils Relative: 0.2 %
Eosinophils Absolute: 70 cells/uL (ref 15–500)
Eosinophils Relative: 1.1 %
HCT: 43.9 % (ref 38.5–50.0)
Hemoglobin: 14.9 g/dL (ref 13.2–17.1)
Lymphs Abs: 2150 cells/uL (ref 850–3900)
MCH: 30.7 pg (ref 27.0–33.0)
MCHC: 33.9 g/dL (ref 32.0–36.0)
MCV: 90.5 fL (ref 80.0–100.0)
MPV: 10.2 fL (ref 7.5–12.5)
Monocytes Relative: 8.3 %
Neutro Abs: 3635 cells/uL (ref 1500–7800)
Neutrophils Relative %: 56.8 %
Platelets: 227 10*3/uL (ref 140–400)
RBC: 4.85 10*6/uL (ref 4.20–5.80)
RDW: 13.1 % (ref 11.0–15.0)
Total Lymphocyte: 33.6 %
WBC: 6.4 10*3/uL (ref 3.8–10.8)

## 2022-09-25 MED ORDER — HYDROCORTISONE (PERIANAL) 2.5 % EX CREA: 1.0000 | TOPICAL_CREAM | Freq: Two times a day (BID) | CUTANEOUS | 0 refills | Status: DC

## 2022-09-25 NOTE — Progress Notes (Signed)
Patient ID: Frederick Stephens, male    DOB: [birth year], 76 y.o.   MRN: [id number]  This visit was conducted in person.  BP 114/70   Pulse 87   Temp 98.2 F (36.8 C)   Resp 16   Ht '5\' 9"'$  (1.753 m)   Wt 189 lb 4 oz (85.8 kg)   SpO2 96%   BMI 27.95 kg/m    CC:  Chief Complaint  Patient presents with   GI Problem    Started at 7 pm throwing up    Diarrhea    Started last night and seen blood in stool this am.    Subjective:   HPI: Frederick Stephens is a 76 y.o. male patient of Dr. Danise Mina with history of hypertension and congestive heart failure, immunocompromised for heart transplant presenting on 09/25/2022 for GI Problem (Started at 7 pm throwing up ) and Diarrhea (Started last night and seen blood in stool this am.)  He reports new onset  nausea, emesis and diarrhea.  No fever.  Developed abdominal pain occ prior to BM. Cramping pain relieved with BM.  Has had loose stool 4 today, watery.  Saw blood clot in liquid stool this AM,  bright red blood tinged with BM right prior to coming to office today.  Ate out that day.Marland Kitchen MCDonald.   Has been able to Have Gatorade 8 oz. Some bread     No sick contacts. 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  2015: normal colonoscopy.   No recent travel or antibitoics.   Since transplant   he a rash around rectum.. treating with topical conrtisone.. no pai in rectum... rash is itchy... has never cause bleeding. Relevant past medical, surgical, family and social history reviewed and updated as indicated. Interim medical history since our last visit reviewed. Allergies and medications reviewed and updated. Outpatient Medications Prior to Visit  Medication Sig Dispense Refill   allopurinol (ZYLOPRIM) 100 MG tablet Take 1.5 tablets (150 mg total) by mouth daily. 135 tablet 1   amLODipine (NORVASC) 2.5 MG tablet Take 2.5 mg by mouth 2 (two) times daily.     amoxicillin (AMOXIL) 500 MG capsule TAKE 4 CAPSULES BY MOUTH DAILY  AS NEEDED (FOR DENTAL PROCEDURES). 4 capsule 3   aspirin 81 MG tablet Take 81 mg by mouth daily.     Calcium Carb-Cholecalciferol (CALCIUM-VITAMIN D) 600-400 MG-UNIT TABS Take 1 tablet by mouth daily.     COLCRYS 0.6 MG tablet TAKE 1 TABLET (0.6 MG TOTAL) BY MOUTH 2 (TWO) TIMES DAILY. AS NEEDED GOUT FLARE 30 tablet 2   hydrocortisone (ANUSOL-HC) 2.5 % rectal cream Place 1 application rectally daily as needed.     Magnesium 400 MG TABS Take 1 tablet by mouth 3 (three) times daily.     mycophenolate (CELLCEPT) 250 MG capsule Take 500 mg by mouth 2 (two) times daily.     omeprazole (PRILOSEC) 20 MG capsule Take 1 tablet by mouth daily.     pravastatin (PRAVACHOL) 20 MG tablet Take 20 mg by mouth every evening.     propranolol (INDERAL) 80 MG tablet Take 80 mg by mouth daily.     sildenafil (REVATIO) 20 MG tablet Take 2 tablets (40 mg total) by mouth daily as needed. 30 tablet 11   tacrolimus (PROGRAF) 0.5 MG capsule Take 0.5 mg by mouth 2 (two) times daily.     No facility-administered medications prior to visit.     Per HPI unless specifically indicated in ROS  section below Review of Systems  Constitutional:  Negative for fatigue and fever.  HENT:  Negative for ear pain.   Eyes:  Negative for pain.  Respiratory:  Negative for cough and shortness of breath.   Cardiovascular:  Negative for chest pain, palpitations and leg swelling.  Gastrointestinal:  Positive for abdominal pain, blood in stool, diarrhea, nausea and vomiting. Negative for abdominal distention, anal bleeding and rectal pain.  Genitourinary:  Negative for dysuria.  Musculoskeletal:  Negative for arthralgias.  Neurological:  Negative for syncope, light-headedness and headaches.  Psychiatric/Behavioral:  Negative for dysphoric mood.    Objective:  BP 114/70   Pulse 87   Temp 98.2 F (36.8 C)   Resp 16   Ht '5\' 9"'$  (1.753 m)   Wt 189 lb 4 oz (85.8 kg)   SpO2 96%   BMI 27.95 kg/m   Wt Readings from Last 3 Encounters:   09/25/22 189 lb 4 oz (85.8 kg)  07/10/22 189 lb (85.7 kg)  10/08/20 196 lb 7 oz (89.1 kg)      Physical Exam Constitutional:      Appearance: He is well-developed.  HENT:     Head: Normocephalic.     Right Ear: Hearing normal.     Left Ear: Hearing normal.     Nose: Nose normal.  Neck:     Thyroid: No thyroid mass or thyromegaly.     Vascular: No carotid bruit.     Trachea: Trachea normal.  Cardiovascular:     Rate and Rhythm: Normal rate and regular rhythm.     Pulses: Normal pulses.     Heart sounds: Heart sounds not distant. No murmur heard.    No friction rub. No gallop.     Comments: No peripheral edema Pulmonary:     Effort: Pulmonary effort is normal. No respiratory distress.     Breath sounds: Normal breath sounds.  Abdominal:     General: Abdomen is protuberant.     Palpations: Abdomen is soft.     Tenderness: There is no abdominal tenderness.  Genitourinary:    Rectum: Guaiac result negative. Internal hemorrhoid present.  Skin:    General: Skin is warm and dry.     Findings: No rash.  Psychiatric:        Speech: Speech normal.        Behavior: Behavior normal.        Thought Content: Thought content normal.       Results for orders placed or performed in visit on 12/09/20  Novel Coronavirus, NAA ([facility])   Specimen: Nasopharyngeal(NP) swabs in vial transport medium   Nasopharynge  Screenin  Result Value Ref Range   SARS-CoV-2, NAA Not Detected Not Detected  SARS-COV-2, NAA 2 DAY TAT   Nasopharynge  Screenin  Result Value Ref Range   SARS-CoV-2, NAA 2 DAY TAT Performed      COVID 19 screen:  No recent travel or known exposure to COVID19 The patient denies respiratory symptoms of COVID 19 at this time. The importance of social distancing was discussed today.   Assessment and Plan    Problem List Items Addressed This Visit     Bloody diarrhea - Primary    Acute, most likely acute diarrheal illness causing irritation of hemorrhoids resulting  in bright red blood per rectum.  Stop aspirin for now.  Increase Gatorade with sips , advance as tolerate to rehydrate.  If not able to keep down liquids or decreased urination go to ER.  Please  stop at the lab to have labs drawn.  Go to ER if  blood in stool not stopping  or if heavy.    Once acute illness over .. start  topical hemorrhoid cream prescription for no longer than 1-2 weeks.      Relevant Orders   Comprehensive metabolic panel (Completed)   CBC with Differential/Platelet (Completed)   C. difficile GDH and Toxin A/B (Completed)   Gastrointestinal Pathogen Pnl RT, PCR (Completed)   Meds ordered this encounter  Medications   hydrocortisone (ANUSOL-HC) 2.5 % rectal cream    Sig: Place 1 Application rectally 2 (two) times daily.    Dispense:  30 g    Refill:  0     Eliezer Lofts, MD

## 2022-09-25 NOTE — Patient Instructions (Addendum)
Stop aspirin for now.  Increase Gatorade with sips , advance as tolerate to rehydrate.  If not able to keep down liquids or decreased urination go to ER.  Please stop at the lab to have labs drawn.  Go to ER if  blood in stool not stopping  or if heavy.    Once acute illness over .Marland Kitchen You can start  topical hemorrhoid cream prescription for no longer than 1-2 weeks.

## 2022-09-29 LAB — C. DIFFICILE GDH AND TOXIN A/B
GDH ANTIGEN: NOT DETECTED
MICRO NUMBER:: 14087019
SPECIMEN QUALITY:: ADEQUATE
TOXIN A AND B: NOT DETECTED

## 2022-09-30 LAB — GASTROINTESTINAL PATHOGEN PNL
CampyloBacter Group: NOT DETECTED
Norovirus GI/GII: NOT DETECTED
Rotavirus A: NOT DETECTED
Salmonella species: NOT DETECTED
Shiga Toxin 1: NOT DETECTED
Shiga Toxin 2: NOT DETECTED
Shigella Species: NOT DETECTED
Vibrio Group: NOT DETECTED
Yersinia enterocolitica: NOT DETECTED

## 2022-10-01 ENCOUNTER — Ambulatory Visit (INDEPENDENT_AMBULATORY_CARE_PROVIDER_SITE_OTHER): Payer: Medicare Other

## 2022-10-01 VITALS — Ht 69.0 in | Wt 189.0 lb

## 2022-10-01 DIAGNOSIS — Z0000 Encounter for general adult medical examination without abnormal findings: Secondary | ICD-10-CM

## 2022-10-01 NOTE — Patient Instructions (Addendum)
Frederick Stephens , Thank you for taking time to come for your Medicare Wellness Visit. I appreciate your ongoing commitment to your health goals. Please review the following plan we discussed and let me know if I can assist you in the future.   These are the goals we discussed:  Goals       Patient stated (pt-stated)      Pay off some bills.        This is a list of the screening recommended for you and due dates:  Health Maintenance  Topic Date Due   Hepatitis C Screening: USPSTF Recommendation to screen - Ages 36-79 yo.  Never done   Zoster (Shingles) Vaccine (1 of 2) 01/01/2023*   COVID-19 Vaccine (7 - Pfizer risk series) 11/10/2022   Medicare Annual Wellness Visit  11/01/2023   Colon Cancer Screening  10/09/2024   Tetanus Vaccine  11/23/2028   Pneumonia Vaccine  Completed   Flu Shot  Completed   HPV Vaccine  Aged Out  *Topic was postponed. The date shown is not the original due date.    Advanced directives: Please bring a copy of your health care power of attorney and living will to the office to be added to your chart at your convenience.   Conditions/risks identified: None  Next appointment: Follow up in one year for your annual wellness visit.    Preventive Care 75 Years and Older, Male  Preventive care refers to lifestyle choices and visits with your health care provider that can promote health and wellness. What does preventive care include? A yearly physical exam. This is also called an annual well check. Dental exams once or twice a year. Routine eye exams. Ask your health care provider how often you should have your eyes checked. Personal lifestyle choices, including: Daily care of your teeth and gums. Regular physical activity. Eating a healthy diet. Avoiding tobacco and drug use. Limiting alcohol use. Practicing safe sex. Taking low doses of aspirin every day. Taking vitamin and mineral supplements as recommended by your health care provider. What happens  during an annual well check? The services and screenings done by your health care provider during your annual well check will depend on your age, overall health, lifestyle risk factors, and family history of disease. Counseling  Your health care provider may ask you questions about your: Alcohol use. Tobacco use. Drug use. Emotional well-being. Home and relationship well-being. Sexual activity. Eating habits. History of falls. Memory and ability to understand (cognition). Work and work Statistician. Screening  You may have the following tests or measurements: Height, weight, and BMI. Blood pressure. Lipid and cholesterol levels. These may be checked every 5 years, or more frequently if you are over 68 years old. Skin check. Lung cancer screening. You may have this screening every year starting at age 24 if you have a 30-pack-year history of smoking and currently smoke or have quit within the past 15 years. Fecal occult blood test (FOBT) of the stool. You may have this test every year starting at age 50. Flexible sigmoidoscopy or colonoscopy. You may have a sigmoidoscopy every 5 years or a colonoscopy every 10 years starting at age 71. Prostate cancer screening. Recommendations will vary depending on your family history and other risks. Hepatitis C blood test. Hepatitis B blood test. Sexually transmitted disease (STD) testing. Diabetes screening. This is done by checking your blood sugar (glucose) after you have not eaten for a while (fasting). You may have this done every 1-3 years.  Abdominal aortic aneurysm (AAA) screening. You may need this if you are a current or former smoker. Osteoporosis. You may be screened starting at age 4 if you are at high risk. Talk with your health care provider about your test results, treatment options, and if necessary, the need for more tests. Vaccines  Your health care provider may recommend certain vaccines, such as: Influenza vaccine. This is  recommended every year. Tetanus, diphtheria, and acellular pertussis (Tdap, Td) vaccine. You may need a Td booster every 10 years. Zoster vaccine. You may need this after age 75. Pneumococcal 13-valent conjugate (PCV13) vaccine. One dose is recommended after age 75. Pneumococcal polysaccharide (PPSV23) vaccine. One dose is recommended after age 75. Talk to your health care provider about which screenings and vaccines you need and how often you need them. This information is not intended to replace advice given to you by your health care provider. Make sure you discuss any questions you have with your health care provider. Document Released: 12/20/2015 Document Revised: 08/12/2016 Document Reviewed: 09/24/2015 Elsevier Interactive Patient Education  2017 [place] Prevention in the Home Falls can cause injuries. They can happen to people of all ages. There are many things you can do to make your home safe and to help prevent falls. What can I do on the outside of my home? Regularly fix the edges of walkways and driveways and fix any cracks. Remove anything that might make you trip as you walk through a door, such as a raised step or threshold. Trim any bushes or trees on the path to your home. Use bright outdoor lighting. Clear any walking paths of anything that might make someone trip, such as rocks or tools. Regularly check to see if handrails are loose or broken. Make sure that both sides of any steps have handrails. Any raised decks and porches should have guardrails on the edges. Have any leaves, snow, or ice cleared regularly. Use sand or salt on walking paths during winter. Clean up any spills in your garage right away. This includes oil or grease spills. What can I do in the bathroom? Use night lights. Install grab bars by the toilet and in the tub and shower. Do not use towel bars as grab bars. Use non-skid mats or decals in the tub or shower. If you need to sit down in  the shower, use a plastic, non-slip stool. Keep the floor dry. Clean up any water that spills on the floor as soon as it happens. Remove soap buildup in the tub or shower regularly. Attach bath mats securely with double-sided non-slip rug tape. Do not have throw rugs and other things on the floor that can make you trip. What can I do in the bedroom? Use night lights. Make sure that you have a light by your bed that is easy to reach. Do not use any sheets or blankets that are too big for your bed. They should not hang down onto the floor. Have a firm chair that has side arms. You can use this for support while you get dressed. Do not have throw rugs and other things on the floor that can make you trip. What can I do in the kitchen? Clean up any spills right away. Avoid walking on wet floors. Keep items that you use a lot in easy-to-reach places. If you need to reach something above you, use a strong step stool that has a grab bar. Keep electrical cords out of the way. Do not  use floor polish or wax that makes floors slippery. If you must use wax, use non-skid floor wax. Do not have throw rugs and other things on the floor that can make you trip. What can I do with my stairs? Do not leave any items on the stairs. Make sure that there are handrails on both sides of the stairs and use them. Fix handrails that are broken or loose. Make sure that handrails are as long as the stairways. Check any carpeting to make sure that it is firmly attached to the stairs. Fix any carpet that is loose or worn. Avoid having throw rugs at the top or bottom of the stairs. If you do have throw rugs, attach them to the floor with carpet tape. Make sure that you have a light switch at the top of the stairs and the bottom of the stairs. If you do not have them, ask someone to add them for you. What else can I do to help prevent falls? Wear shoes that: Do not have high heels. Have rubber bottoms. Are comfortable  and fit you well. Are closed at the toe. Do not wear sandals. If you use a stepladder: Make sure that it is fully opened. Do not climb a closed stepladder. Make sure that both sides of the stepladder are locked into place. Ask someone to hold it for you, if possible. Clearly mark and make sure that you can see: Any grab bars or handrails. First and last steps. Where the edge of each step is. Use tools that help you move around (mobility aids) if they are needed. These include: Canes. Walkers. Scooters. Crutches. Turn on the lights when you go into a dark area. Replace any light bulbs as soon as they burn out. Set up your furniture so you have a clear path. Avoid moving your furniture around. If any of your floors are uneven, fix them. If there are any pets around you, be aware of where they are. Review your medicines with your doctor. Some medicines can make you feel dizzy. This can increase your chance of falling. Ask your doctor what other things that you can do to help prevent falls. This information is not intended to replace advice given to you by your health care provider. Make sure you discuss any questions you have with your health care provider. Document Released: 09/19/2009 Document Revised: 04/30/2016 Document Reviewed: 12/28/2014 Elsevier Interactive Patient Education  2017 [employer].

## 2022-10-01 NOTE — Progress Notes (Signed)
Subjective:   Frederick Stephens is a 76 y.o. male who presents for Medicare Annual/Subsequent preventive examination.  Review of Systems   Cardiac Risk Factors include: advanced age (>110mn, >>18women);hypertension;male gender Virtual Visit via Telephone Note  I connected with  Frederick Crosbyon 10/01/22 at 11:15 AM EDT by telephone and verified that I am speaking with the correct person using two identifiers.  Location: Patient: Home Provider: Office Persons participating in the virtual visit: patient/Nurse Health Advisor   I discussed the limitations, risks, security and privacy concerns of performing an evaluation and management service by telephone and the availability of in person appointments. The patient expressed understanding and agreed to proceed.  Interactive audio and video telecommunications were attempted between this nurse and patient, however failed, due to patient having technical difficulties OR patient did not have access to video capability.  We continued and completed visit with audio only.  Some vital signs may be absent or patient reported.   BCriselda Peaches LPN     Objective:    Today's Vitals   10/01/22 1121  Weight: 189 lb (85.7 kg)  Height: '5\' 9"'$  (1.753 m)   Body mass index is 27.91 kg/m.     10/01/2022   11:28 AM 11/07/2015    1:05 AM  Advanced Directives  Does Patient Have a Medical Advance Directive? Yes No  Type of AParamedicof APanaLiving will   [place]? No - copy requested     Current Medications (verified) Outpatient Encounter Medications as of 10/01/2022  Medication Sig   allopurinol (ZYLOPRIM) 100 MG tablet Take 1.5 tablets (150 mg total) by mouth daily.   amLODipine (NORVASC) 2.5 MG tablet Take 2.5 mg by mouth 2 (two) times daily.   amoxicillin (AMOXIL) 500 MG capsule TAKE 4 CAPSULES BY MOUTH DAILY AS NEEDED (FOR DENTAL PROCEDURES).   aspirin 81 MG tablet Take 81  mg by mouth daily.   Calcium Carb-Cholecalciferol (CALCIUM-VITAMIN D) 600-400 MG-UNIT TABS Take 1 tablet by mouth daily.   COLCRYS 0.6 MG tablet TAKE 1 TABLET (0.6 MG TOTAL) BY MOUTH 2 (TWO) TIMES DAILY. AS NEEDED GOUT FLARE   hydrocortisone (ANUSOL-HC) 2.5 % rectal cream Place 1 application rectally daily as needed.   hydrocortisone (ANUSOL-HC) 2.5 % rectal cream Place 1 Application rectally 2 (two) times daily.   Magnesium 400 MG TABS Take 1 tablet by mouth 3 (three) times daily.   mycophenolate (CELLCEPT) 250 MG capsule Take 500 mg by mouth 2 (two) times daily.   omeprazole (PRILOSEC) 20 MG capsule Take 1 tablet by mouth daily.   pravastatin (PRAVACHOL) 20 MG tablet Take 20 mg by mouth every evening.   propranolol (INDERAL) 80 MG tablet Take 80 mg by mouth daily.   sildenafil (REVATIO) 20 MG tablet Take 2 tablets (40 mg total) by mouth daily as needed.   tacrolimus (PROGRAF) 0.5 MG capsule Take 0.5 mg by mouth 2 (two) times daily.   No facility-administered encounter medications on file as of 10/01/2022.    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   due to heart failure,cardiomyopathy   Past Surgical History:  Procedure Laterality Date   CARDIAC CATHETERIZATION  12/2020   normal coronary arteries - [facility]   COLONOSCOPY  10/2014   [facility])   dobutamine stress test  11/2014   normal   ESOPHAGOGASTRODUODENOSCOPY  07/2018   soft palate polyp (rec  f/u ENT), esophageal strictures x2 s/p dilatation, HH ([facility])   HEART TRANSPLANT  11/29/08   [facility] (Dr Thalia Party IV)   Family History  Problem Relation Age of Onset   Gout Mother    Hypertension Mother    Heart disease Mother    Gout Brother    Hypertension Brother    Heart disease Father    Diabetes Brother    Cancer Neg Hx    Stroke Neg Hx    Social History   Socioeconomic History   Marital status: Married    Spouse name: Not on file   Number of children: Not on file   Years of education: Not on file   Highest education level: Not on file  Occupational History   Not on file  Tobacco Use   Smoking status: Never   Smokeless tobacco: Never  Substance and Sexual Activity   Alcohol use: No   Drug use: No   Sexual activity: Not on file  Other Topics Concern   Not on file  Social History Narrative   ** Merged History Encounter **       Lives with wife Son lives nearby - as well as extended family in [place]. 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 [employer]: AA Activity: goes to Y 3x/wk Diet: some water, fruits/vegetables daily   Social Determinants of Health   Financial Resource Strain: Low Risk  (10/01/2022)   Overall Financial Resource Strain (CARDIA)    Difficulty of Paying Living Expenses: Not hard at all  Food Insecurity: No Food Insecurity (10/01/2022)   Hunger Vital Sign    Worried About Running Out of Food in the Last Year: Never true    Ran Out of Food in the Last Year: Never true  Transportation Needs: No Transportation Needs (10/01/2022)   PRAPARE - Hydrologist (Medical): No    Lack of Transportation (Non-Medical): No  Physical Activity: Insufficiently Active (10/01/2022)   Exercise Vital Sign    Days of Exercise per Week: 2 days    Minutes of Exercise per Session: 60 min  Stress: No Stress Concern Present (10/01/2022)   [place]    Feeling  of Stress : Not at all  Social Connections: [place] (10/01/2022)   Social Connection and Isolation Panel [NHANES]    Frequency of Communication with Friends and Family: More than three times a week    Frequency of Social Gatherings with Friends and Family: More than three times a week    Attends Religious Services: More than 4 times per year    Active Member of [employer] or Organizations: Yes    Attends Music therapist: More than 4 times per year    Marital Status: Married    Tobacco Counseling Counseling given: Not Answered   Clinical Intake:  Pre-visit preparation completed: No  Pain : No/denies pain  BMI - recorded: 27.91 Nutritional Status: BMI 25 -29 Overweight Nutritional Risks: None Diabetes: No  How often do you need to have someone help you when you read instructions, pamphlets, or other written materials from your doctor or pharmacy?: 1 - Never  Diabetic? No  Interpreter Needed?: No  Information entered by :: Rolene Arbour LPN   Activities of Daily Living    10/01/2022   11:26 AM  In your present state of health, do you have any difficulty performing the following activities:  Hearing? 0  Vision? 0  Difficulty concentrating or making decisions? 0  Walking or climbing stairs? 0  Dressing or bathing? 0  Doing errands, shopping? 0  Preparing Food and eating ? N  Using the Toilet? N  In the past six months, have you accidently leaked urine? N  Do you have problems with loss of bowel control? N  Managing your Medications? N  Managing your Finances? N  Housekeeping or managing your Housekeeping? N    Patient Care Team: Ria Bush, MD as PCP - General (Family Medicine)  Indicate any recent Medical Services you may have received from other than [facility] providers in the past year (date may be approximate).     Assessment:   This is a routine wellness examination for [place].  Hearing/Vision screen Hearing Screening -  Comments:: Denies hearing difficulties   Vision Screening - Comments:: Wears rx glasses - up to date with routine eye exams with  [place] issues and exercise activities discussed: Current Exercise Habits: Home exercise routine, Type of exercise: walking, Time (Minutes): 60, Frequency (Times/Week): 2, Weekly Exercise (Minutes/Week): 120, Intensity: Moderate, Exercise limited by: None identified   Goals Addressed               This Visit's Progress     Patient stated (pt-stated)        Pay off some bills.       Depression Screen    10/01/2022   11:25 AM 10/19/2018   12:18 PM 08/20/2016    4:19 PM 01/15/2015    3:06 PM  PHQ 2/9 Scores  PHQ - 2 Score 0 0 2 0  PHQ- 9 Score   6     Fall Risk    10/01/2022   11:27 AM 10/19/2018   12:18 PM 08/20/2016    4:19 PM 01/15/2015    3:06 PM  Fall Risk   Falls in the past year? 0 0 No No  Number falls in past yr: 0     Injury with Fall? 0     Risk for fall due to : No Fall Risks     Follow up Falls prevention discussed       [place]:  Any stairs in or around the home? Yes  If so, are there any without handrails? No  Home free of loose throw rugs in walkways, pet beds, electrical cords, etc? Yes  Adequate lighting in your home to reduce risk of falls? Yes   ASSISTIVE DEVICES UTILIZED TO PREVENT FALLS:  Life alert? No  Use of a cane, walker or w/c? No  Grab bars in the bathroom? No  Shower chair or bench in shower? No  Elevated toilet seat or a handicapped toilet? No   TIMED UP AND GO:  Was the test performed? No . Audio Visit  Cognitive Function:        10/01/2022   11:28 AM  6CIT Screen  What Year? 0 points  What month? 0 points  What time? 0 points  Count back from 20 0 points  Months in reverse 0 points  Repeat phrase 0 points  Total Score 0 points    Immunizations Immunization History  Administered Date(s) Administered   Fluad Quad(high Dose 65+)  09/11/2019   Influenza Whole 09/06/2013   Influenza, High Dose Seasonal PF 09/22/2017, 10/01/2020   Influenza,inj,Quad PF,6+ Mos 08/30/2015, 08/13/2016, 10/19/2018, 09/15/2022   Influenza-Unspecified 09/06/2014   Moderna SARS-COV2 Booster Vaccination 09/15/2022   PFIZER Comirnaty(Gray Top)Covid-19 Tri-Sucrose Vaccine 01/27/2020, 02/20/2020, 04/29/2021   PFIZER(Purple Top)SARS-COV-2 Vaccination 01/27/2020, 02/20/2020, 08/13/2020   Pneumococcal Conjugate-13 01/15/2015   Pneumococcal Polysaccharide-23 08/20/2016   Tdap 11/23/2017, 11/23/2018    TDAP status: Up to date  Flu Vaccine status: Up to date  Pneumococcal vaccine status: Up to date  Covid-19 vaccine status: Completed vaccines  Qualifies for Shingles Vaccine? Yes   Zostavax completed No   Shingrix Completed?: No.    Education has been provided regarding the importance of this vaccine. Patient has been advised to call insurance company to determine out of pocket expense if they have not yet received this vaccine. Advised may also receive vaccine at local pharmacy or Health Dept. Verbalized acceptance and understanding.  Screening Tests Health Maintenance  Topic Date Due   Hepatitis C Screening  Never done   Zoster Vaccines- Shingrix (1 of 2) 01/01/2023 (Originally 12/03/1965)   COVID-19 Vaccine (7 - Pfizer risk series) 11/10/2022   Medicare Annual Wellness (AWV)  11/01/2023   COLONOSCOPY (Pts 45-57yr Insurance coverage will need to be confirmed)  10/09/2024   TETANUS/TDAP  11/23/2028   Pneumonia Vaccine 75 Years old  Completed   INFLUENZA VACCINE  Completed   HPV VACCINES  Aged Out    Health Maintenance  Health Maintenance Due  Topic Date Due   Hepatitis C Screening  Never done    Colorectal cancer screening: Type of screening: Colonoscopy. Completed 10/09/14. Repeat every 10 years  Lung Cancer Screening: (Low Dose CT Chest recommended if Age 75-80years, 30 pack-year currently smoking OR have quit w/in 15years.)  does not qualify.    Additional Screening:  Hepatitis C Screening: does qualify; Completed Deferred  Vision Screening: Recommended annual ophthalmology exams for early detection of glaucoma and other disorders of the eye. Is the patient up to date with their annual eye exam?  Yes  Who is the provider or what is the name of the office in which the patient attends annual eye exams? [place] is not established with a provider, would they like to be referred to a provider to establish care? No .   Dental Screening: Recommended annual dental exams for proper oral hygiene  Community Resource Referral / Chronic Care Management:  CRR required this visit?  No   CCM required this visit?  No      Plan:     I have personally reviewed and noted the following in the patient's chart:   Medical and social history Use of alcohol, tobacco or illicit drugs  Current medications and supplements including opioid prescriptions. Patient is not currently taking opioid prescriptions. Functional ability and status Nutritional status Physical activity Advanced directives List of other physicians Hospitalizations, surgeries, and ER visits in previous 12 months Vitals Screenings to include cognitive, depression, and falls Referrals and appointments  In addition, I have reviewed and discussed with patient certain preventive protocols, quality metrics, and best practice recommendations. A written personalized care plan for  preventive services as well as general preventive health recommendations were provided to patient.     Criselda Peaches, LPN   [id number]   Nurse Notes: Patient due labs Hep-C Screening

## 2022-10-31 DIAGNOSIS — R197 Diarrhea, unspecified: Secondary | ICD-10-CM | POA: Insufficient documentation

## 2022-10-31 NOTE — Assessment & Plan Note (Signed)
Acute, most likely acute diarrheal illness causing irritation of hemorrhoids resulting in bright red blood per rectum.  Stop aspirin for now.  Increase Gatorade with sips , advance as tolerate to rehydrate.  If not able to keep down liquids or decreased urination go to ER.  Please stop at the lab to have labs drawn.  Go to ER if  blood in stool not stopping  or if heavy.    Once acute illness over .. start  topical hemorrhoid cream prescription for no longer than 1-2 weeks.

## 2022-11-25 ENCOUNTER — Other Ambulatory Visit: Payer: Self-pay | Admitting: Family Medicine

## 2022-11-25 NOTE — Telephone Encounter (Signed)
Anusol cream Last filled 09/25/22, #30 g Last OV:  112/2/21, L leg pain Next OV:  none  Pt had AWV 10/01/22. Spoke with pt offering to schedule CPE and labs but pt states he was on his way to work and will call back tomorrow.

## 2023-02-13 ENCOUNTER — Other Ambulatory Visit: Payer: Self-pay | Admitting: Family Medicine

## 2023-02-13 DIAGNOSIS — M1A00X0 Idiopathic chronic gout, unspecified site, without tophus (tophi): Secondary | ICD-10-CM

## 2023-02-13 DIAGNOSIS — E785 Hyperlipidemia, unspecified: Secondary | ICD-10-CM

## 2023-02-13 DIAGNOSIS — E559 Vitamin D deficiency, unspecified: Secondary | ICD-10-CM

## 2023-02-13 DIAGNOSIS — N400 Enlarged prostate without lower urinary tract symptoms: Secondary | ICD-10-CM

## 2023-02-13 DIAGNOSIS — Z1159 Encounter for screening for other viral diseases: Secondary | ICD-10-CM

## 2023-02-17 ENCOUNTER — Other Ambulatory Visit (INDEPENDENT_AMBULATORY_CARE_PROVIDER_SITE_OTHER): Payer: Medicare Other

## 2023-02-17 DIAGNOSIS — N400 Enlarged prostate without lower urinary tract symptoms: Secondary | ICD-10-CM | POA: Diagnosis not present

## 2023-02-17 DIAGNOSIS — E559 Vitamin D deficiency, unspecified: Secondary | ICD-10-CM

## 2023-02-17 DIAGNOSIS — M1A00X0 Idiopathic chronic gout, unspecified site, without tophus (tophi): Secondary | ICD-10-CM

## 2023-02-17 DIAGNOSIS — Z1159 Encounter for screening for other viral diseases: Secondary | ICD-10-CM

## 2023-02-17 DIAGNOSIS — E785 Hyperlipidemia, unspecified: Secondary | ICD-10-CM | POA: Diagnosis not present

## 2023-02-17 LAB — LIPID PANEL
Cholesterol: 164 mg/dL (ref 0–200)
HDL: 38.2 mg/dL — ABNORMAL LOW (ref >39.00)
LDL Cholesterol: 96 mg/dL (ref 0–99)
NonHDL: 125.8
Total CHOL/HDL Ratio: 4
Triglycerides: 147 mg/dL (ref 0.0–149.0)
VLDL: 29.4 mg/dL (ref 0.0–40.0)

## 2023-02-17 LAB — URIC ACID: Uric Acid, Serum: 4.3 mg/dL (ref 4.0–7.8)

## 2023-02-17 LAB — VITAMIN D 25 HYDROXY (VIT D DEFICIENCY, FRACTURES): VITD: 36.97 ng/mL (ref 30.00–100.00)

## 2023-02-17 LAB — COMPREHENSIVE METABOLIC PANEL
ALT: 9 U/L (ref 0–53)
AST: 11 U/L (ref 0–37)
Albumin: 3.9 g/dL (ref 3.5–5.2)
Alkaline Phosphatase: 68 U/L (ref 39–117)
BUN: 15 mg/dL (ref 6–23)
CO2: 26 mEq/L (ref 19–32)
Calcium: 9.4 mg/dL (ref 8.4–10.5)
Chloride: 109 mEq/L (ref 96–112)
Creatinine, Ser: 1.14 mg/dL (ref 0.40–1.50)
GFR: 63.05 mL/min (ref >60.00)
Glucose, Bld: 138 mg/dL — ABNORMAL HIGH (ref 70–99)
Potassium: 4.4 mEq/L (ref 3.5–5.1)
Sodium: 143 mEq/L (ref 135–145)
Total Bilirubin: 0.5 mg/dL (ref 0.2–1.2)
Total Protein: 6.5 g/dL (ref 6.0–8.3)

## 2023-02-17 LAB — PSA: PSA: 2.9 ng/mL (ref 0.10–4.00)

## 2023-02-17 LAB — MAGNESIUM: Magnesium: 1.8 mg/dL (ref 1.5–2.5)

## 2023-02-18 LAB — HEPATITIS C ANTIBODY: Hepatitis C Ab: NONREACTIVE

## 2023-02-24 ENCOUNTER — Encounter: Payer: Self-pay | Admitting: Family Medicine

## 2023-02-24 ENCOUNTER — Telehealth: Payer: Self-pay

## 2023-02-24 ENCOUNTER — Ambulatory Visit (INDEPENDENT_AMBULATORY_CARE_PROVIDER_SITE_OTHER): Payer: Medicare Other | Admitting: Family Medicine

## 2023-02-24 VITALS — BP 116/78 | HR 79 | Temp 97.2°F | Ht 67.75 in | Wt 188.5 lb

## 2023-02-24 DIAGNOSIS — R197 Diarrhea, unspecified: Secondary | ICD-10-CM

## 2023-02-24 DIAGNOSIS — I509 Heart failure, unspecified: Secondary | ICD-10-CM

## 2023-02-24 DIAGNOSIS — N400 Enlarged prostate without lower urinary tract symptoms: Secondary | ICD-10-CM

## 2023-02-24 DIAGNOSIS — G250 Essential tremor: Secondary | ICD-10-CM

## 2023-02-24 DIAGNOSIS — M1A00X0 Idiopathic chronic gout, unspecified site, without tophus (tophi): Secondary | ICD-10-CM

## 2023-02-24 DIAGNOSIS — E559 Vitamin D deficiency, unspecified: Secondary | ICD-10-CM

## 2023-02-24 DIAGNOSIS — L290 Pruritus ani: Secondary | ICD-10-CM | POA: Diagnosis not present

## 2023-02-24 DIAGNOSIS — M810 Age-related osteoporosis without current pathological fracture: Secondary | ICD-10-CM

## 2023-02-24 DIAGNOSIS — E785 Hyperlipidemia, unspecified: Secondary | ICD-10-CM

## 2023-02-24 DIAGNOSIS — Z941 Heart transplant status: Secondary | ICD-10-CM

## 2023-02-24 DIAGNOSIS — Z7189 Other specified counseling: Secondary | ICD-10-CM

## 2023-02-24 DIAGNOSIS — I10 Essential (primary) hypertension: Secondary | ICD-10-CM

## 2023-02-24 DIAGNOSIS — Z0000 Encounter for general adult medical examination without abnormal findings: Secondary | ICD-10-CM

## 2023-02-24 DIAGNOSIS — G4733 Obstructive sleep apnea (adult) (pediatric): Secondary | ICD-10-CM

## 2023-02-24 DIAGNOSIS — D849 Immunodeficiency, unspecified: Secondary | ICD-10-CM

## 2023-02-24 MED ORDER — NYSTATIN 100000 UNIT/GM EX CREA: 1.0000 | TOPICAL_CREAM | Freq: Two times a day (BID) | CUTANEOUS | 1 refills | Status: DC

## 2023-02-24 NOTE — Progress Notes (Unsigned)
Patient ID: Frederick Stephens, male    DOB: [birth year], 77 y.o.   MRN: [id number]  This visit was conducted in person.  BP 116/78   Pulse 79   Temp (!) 97.2 F (36.2 C) (Temporal)   Ht 5' 7.75" (1.721 m)   Wt 188 lb 8 oz (85.5 kg)   SpO2 96%   BMI 28.87 kg/m    CC: CPE Subjective:   HPI: Frederick Stephens is a 77 y.o. male presenting on 02/24/2023 for Annual Exam (MCR prt 2 [AWV- 10/01/22].)   Saw health advisor 09/2022 for medicare wellness visit. Note reviewed.  I last saw patient 10/2020.   No results found.  Flowsheet Row Clinical Support from 10/01/2022 in [place] at [place]  PHQ-2 Total Score 0          10/01/2022   11:27 AM 10/19/2018   12:18 PM 08/20/2016    4:19 PM 01/15/2015    3:06 PM  Fall Risk   Falls in the past year? 0 0 No No  Number falls in past yr: 0     Injury with Fall? 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 Notes decreasing energy levels.   S/p heart transplant 2009 for CHF with cardiomyopathy. Sees Dr Davina Poke cardiology at [facility] prograf and cellcept.  Regularly sees heart transplant clinic, last seen 01/2023.  Planned PET cardiac perfusion stress test 03/2023 and echocardiogram 07/2023.   Episode of blood diarrhea attributed to int hemorrhoids seen by Dr Diona Browner - he uses cortizone-10 OTC, also uses anusol-hc PRN. Steroid suppositories didn't help much. Bowel movements worsen this. No current or recent bleeding but stays itchy, irritated. Remotely saw doc for this but doesn't remember who. Hemorrhoids present since transplant 2009. Offered GI referral - he will consider.   Chronic intention tremor treated with propranolol 20mg  nightly. Notes worse trouble with writing. L>R tremor, R handed   OSA - has declined CPAP therapy .   Preventative: Colonoscopy - normal 10/2014 at [facility] - has not seen  urologist since [place]. H/o elevated PSA but more recently all normal readings. H/o BPH.  Lung cancer screen - not eligible  DEXA unsure date - T -2.5 hip (osteoporosis) s/p 51yrs alendronate. Has seen endocrinology.  Rpt DEXA 12/2017 - osteoporosis T score of -2.5 (hip) consider rpt 2 yrs. Continues calcium/vit D regularly.  He states he saw endocrinology 2023 - will request records.  Flu shot - yearly Prevnar 2016. Pneumovax 08/2016 Tdap 11/2017 RSV - 01/2023  zostavax - not candidate Shingrix - discussed. H/o shingrix. To get at pharmacy.  Advanced planning: has living will at home. HCPOA is wife Carletta. Will bring me copy.  Seat belt use discussed Sunscreen use discussed. No changing moles on skin. Sees derm yearly, upcoming appt 03/2023. Non smoker Alcohol - none Dentist q6 mo  Eye exam due   Lives with wife (2018) Son lives nearby - as well as extended family in [place]. 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 Interim medical history since our last visit reviewed. Allergies and medications reviewed and updated. Outpatient Medications Prior to Visit  Medication Sig Dispense Refill  allopurinol (ZYLOPRIM) 100 MG tablet Take 1.5 tablets (150 mg total) by mouth daily. 135 tablet 1   amLODipine (NORVASC) 2.5 MG tablet Take 2.5 mg by mouth 2 (two) times daily.     amoxicillin (AMOXIL) 500 MG capsule TAKE 4 CAPSULES BY MOUTH DAILY AS NEEDED (FOR DENTAL PROCEDURES). 4 capsule 3   aspirin 81 MG tablet Take 81 mg by mouth daily.     Calcium Carb-Cholecalciferol (CALCIUM-VITAMIN D) 600-400 MG-UNIT TABS Take 1 tablet by mouth daily.     COLCRYS 0.6 MG tablet TAKE 1 TABLET (0.6 MG TOTAL) BY MOUTH 2 (TWO) TIMES DAILY. AS NEEDED GOUT FLARE 30 tablet 2   hydrocortisone cream ([employer] CORTISONE MAXIMUM STRENGTH) 1 % Apply 1  Application topically as needed for itching.     Magnesium 400 MG TABS Take 1 tablet by mouth 3 (three) times daily.     mycophenolate (CELLCEPT) 250 MG capsule Take 500 mg by mouth 2 (two) times daily.     omeprazole (PRILOSEC) 20 MG capsule Take 1 tablet by mouth daily.     pravastatin (PRAVACHOL) 20 MG tablet Take 20 mg by mouth every evening.     propranolol (INDERAL) 80 MG tablet Take 80 mg by mouth daily.     sildenafil (REVATIO) 20 MG tablet Take 2 tablets (40 mg total) by mouth daily as needed. 30 tablet 11   tacrolimus (PROGRAF) 0.5 MG capsule Take 0.5 mg by mouth 2 (two) times daily.     hydrocortisone (ANUSOL-HC) 2.5 % rectal cream Place 1 application rectally daily as needed.     hydrocortisone (ANUSOL-HC) 2.5 % rectal cream PLACE 1 APPLICATION RECTALLY 2 (TWO) TIMES DAILY. (Patient not taking: Reported on 02/24/2023) 30 g 0   No facility-administered medications prior to visit.     Per HPI unless specifically indicated in ROS section below Review of Systems  Constitutional:  Positive for fatigue (low energy). Negative for activity change, appetite change, chills, fever and unexpected weight change.  HENT:  Negative for hearing loss.   Eyes:  Negative for visual disturbance.  Respiratory:  Negative for cough, chest tightness, shortness of breath and wheezing.   Cardiovascular:  Negative for chest pain, palpitations and leg swelling.  Gastrointestinal:  Negative for abdominal distention, abdominal pain, blood in stool, constipation, diarrhea, nausea and vomiting.  Genitourinary:  Negative for difficulty urinating and hematuria.  Musculoskeletal:  Negative for arthralgias, myalgias and neck pain.  Skin:  Negative for rash.  Neurological:  Negative for dizziness, seizures, syncope and headaches.  Hematological:  Negative for adenopathy. Does not bruise/bleed easily.  Psychiatric/Behavioral:  Negative for dysphoric mood. The patient is not nervous/anxious.     Objective:  BP  116/78   Pulse 79   Temp (!) 97.2 F (36.2 C) (Temporal)   Ht 5' 7.75" (1.721 m)   Wt 188 lb 8 oz (85.5 kg)   SpO2 96%   BMI 28.87 kg/m   Wt Readings from Last 3 Encounters:  02/24/23 188 lb 8 oz (85.5 kg)  10/01/22 189 lb (85.7 kg)  09/25/22 189 lb 4 oz (85.8 kg)      Physical Exam Vitals and nursing note reviewed.  Constitutional:      General: He is not in acute distress.    Appearance: Normal appearance. He is well-developed. He is not ill-appearing.  HENT:     Head: Normocephalic and atraumatic.     Right Ear: Hearing, tympanic membrane, ear canal and external ear normal.     Left Ear:  Hearing, tympanic membrane, ear canal and external ear normal.     Mouth/Throat:     Mouth: Mucous membranes are moist.     Pharynx: Oropharynx is clear. No oropharyngeal exudate or posterior oropharyngeal erythema.  Eyes:     General: No scleral icterus.    Extraocular Movements: Extraocular movements intact.     Conjunctiva/sclera: Conjunctivae normal.     Pupils: Pupils are equal, round, and reactive to light.  Neck:     Thyroid: No thyroid mass or thyromegaly.     Vascular: No carotid bruit.  Cardiovascular:     Rate and Rhythm: Normal rate and regular rhythm.     Pulses: Normal pulses.          Radial pulses are 2+ on the right side and 2+ on the left side.     Heart sounds: Normal heart sounds. No murmur heard. Pulmonary:     Effort: Pulmonary effort is normal. No respiratory distress.     Breath sounds: Normal breath sounds. No wheezing, rhonchi or rales.  Abdominal:     General: Bowel sounds are normal. There is no distension.     Palpations: Abdomen is soft. There is no mass.     Tenderness: There is no abdominal tenderness. There is no guarding or rebound.     Hernia: No hernia is present.  Musculoskeletal:        General: Normal range of motion.     Cervical back: Normal range of motion and neck supple.     Right lower leg: No edema.     Left lower leg: No edema.   Lymphadenopathy:     Cervical: No cervical adenopathy.  Skin:    General: Skin is warm and dry.     Findings: Rash present.     Comments: Erythematous pruritic rash around perianal skin   Neurological:     General: No focal deficit present.     Mental Status: He is alert and oriented to person, place, and time.  Psychiatric:        Mood and Affect: Mood normal.        Behavior: Behavior normal.        Thought Content: Thought content normal.        Judgment: Judgment normal.       Results for orders placed or performed in visit on 02/17/23  Hepatitis C antibody  Result Value Ref Range   Hepatitis C Ab NON-REACTIVE NON-REACTIVE  VITAMIN D 25 Hydroxy (Vit-D Deficiency, Fractures)  Result Value Ref Range   VITD 36.97 30.00 - 100.00 ng/mL  Magnesium  Result Value Ref Range   Magnesium 1.8 1.5 - 2.5 mg/dL  PSA  Result Value Ref Range   PSA 2.90 0.10 - 4.00 ng/mL  Uric acid  Result Value Ref Range   Uric Acid, Serum 4.3 4.0 - 7.8 mg/dL  Comprehensive metabolic panel  Result Value Ref Range   Sodium 143 135 - 145 mEq/L   Potassium 4.4 3.5 - 5.1 mEq/L   Chloride 109 96 - 112 mEq/L   CO2 26 19 - 32 mEq/L   Glucose, Bld 138 (H) 70 - 99 mg/dL   BUN 15 6 - 23 mg/dL   Creatinine, Ser 1.14 0.40 - 1.50 mg/dL   Total Bilirubin 0.5 0.2 - 1.2 mg/dL   Alkaline Phosphatase 68 39 - 117 U/L   AST 11 0 - 37 U/L   ALT 9 0 - 53 U/L   Total Protein 6.5 6.0 -  8.3 g/dL   Albumin 3.9 3.5 - 5.2 g/dL   GFR 63.05 >60.00 mL/min   Calcium 9.4 8.4 - 10.5 mg/dL  Lipid panel  Result Value Ref Range   Cholesterol 164 0 - 200 mg/dL   Triglycerides 147.0 0.0 - 149.0 mg/dL   HDL 38.20 (L) >39.00 mg/dL   VLDL 29.4 0.0 - 40.0 mg/dL   LDL Cholesterol 96 0 - 99 mg/dL   Total CHOL/HDL Ratio 4    NonHDL 125.80     Assessment & Plan:   Problem List Items Addressed This Visit   None    No orders of the defined types were placed in this encounter.   No orders of the defined types were placed in  this encounter.   There are no Patient Instructions on file for this visit.  Follow up plan: No follow-ups on file.  Ria Bush, MD

## 2023-02-24 NOTE — Telephone Encounter (Signed)
Dr. Darnell Level is wanting recent endo notes and bone density report for pt from [facility] in [place].  Spoke with pt asking if he has office/doctor name to request the records since there are several offices in [place]. Pt states he saw Dr. Corky Mull ([facility]) who referred him to endo. Says he will call her office to get endo name and fax # so we can request records.

## 2023-02-24 NOTE — Assessment & Plan Note (Addendum)
Preventative protocols reviewed and updated unless pt declined. Discussed healthy diet and lifestyle.  

## 2023-02-24 NOTE — Assessment & Plan Note (Addendum)
Advanced planning: has living will at home. HCPOA is wife Carletta. Will bring me copy.

## 2023-02-24 NOTE — Patient Instructions (Addendum)
We will request latest endocrinology note and/or bone density scan from [facility].  Check with pharmacy about new 2 shot shingles series (shingrix).  Bring me a copy of your advanced directive to update chart.  Stop steroid cream, use nystatin antifungal cream for possible candidal rash around bottom.  Good to see you today Return as needed or in 1 year for next physical.

## 2023-02-25 ENCOUNTER — Encounter: Payer: Self-pay | Admitting: Family Medicine

## 2023-02-25 DIAGNOSIS — D849 Immunodeficiency, unspecified: Secondary | ICD-10-CM | POA: Insufficient documentation

## 2023-02-25 NOTE — Assessment & Plan Note (Signed)
On cellcept and prograf after heart transplant

## 2023-02-25 NOTE — Assessment & Plan Note (Signed)
No obvious hemorrhoidal bleed on exam, but there is persistently erythematous pruritic perianal rash present. Suspect possible candidal infection - stop steroid cream, start nystatin cream and update with effect. If not improved, consider derm eval.

## 2023-02-25 NOTE — Assessment & Plan Note (Signed)
S/p heart transplant, sees cardiac transplant team at [facility]

## 2023-02-25 NOTE — Assessment & Plan Note (Signed)
Chronic, stable period on pravastatin - continue The 10-year ASCVD risk score (Arnett DK, et al., 2019) is: 19.1%   Values used to calculate the score:     Age: 77 years     Sex: Male     Is Non-Hispanic African American: Yes     Diabetic: No     Tobacco smoker: No     Systolic Blood Pressure: 99991111 mmHg     Is BP treated: Yes     HDL Cholesterol: 38.2 mg/dL     Total Cholesterol: 164 mg/dL

## 2023-02-25 NOTE — Assessment & Plan Note (Signed)
Chronic, stable period on current regimen - continue.  

## 2023-02-25 NOTE — Assessment & Plan Note (Signed)
Continues cal/vit D supplement daily.

## 2023-02-25 NOTE — Assessment & Plan Note (Signed)
Magnesium levels stable on TID magnesium supplementation.

## 2023-02-25 NOTE — Assessment & Plan Note (Signed)
No recurrence of this.

## 2023-02-25 NOTE — Assessment & Plan Note (Signed)
Continues allopurinol 150mg  daily with PRN colchicine. No recent gout flare. Urate level stable.

## 2023-02-25 NOTE — Assessment & Plan Note (Addendum)
This is followed by [facility], states he's seen endocrinology - will request records of DEXA and endo OV.

## 2023-02-25 NOTE — Assessment & Plan Note (Signed)
Stable period managed with propranolol.

## 2023-02-25 NOTE — Assessment & Plan Note (Signed)
H/o this, has previously declined CPAP

## 2023-02-25 NOTE — Assessment & Plan Note (Signed)
H/o elevated PSA, previously followed by urology - not currently. Rpt PSA stable

## 2023-02-25 NOTE — Assessment & Plan Note (Addendum)
Appreciate [facility] transplant team care - continues cellcept and prograf.

## 2023-03-02 NOTE — Telephone Encounter (Signed)
Spoke to patient by telephone and was advised that he had a bone density test several years back and can not remember all of the information when and where it was done. Patient stated that he is scheduled to have a bone density test 03/15/23 at [facility] in [place], [place] telephone # [phone].  Patient stated that he will have to do some research and will call back with information regarding his endo and their telephone number. Patient stated that he came down with the flu last week but is feeling better today. Patient was offered an appointment which he declined stating that he feels better and does not feel that he needs to be seen.

## 2023-03-02 NOTE — Telephone Encounter (Deleted)
Spoke to patient by telephone and was advised that he had an endoscopy years ago in [facility] by Dr. Dorrene German his GI doctor. Patient stated that he is not sure that he had a bone density test and if so when and where.  Patient stated that he would have to go thru a lot of paperwork to find that information. Patient stated that he had surgery back in 2018 at [facility] in [place].

## 2023-03-03 NOTE — Telephone Encounter (Signed)
[facility] Coordinator called in stating that the patient has Care Everywhere,so his bone density report should be able to be found in his chart. She left a fax number of [phone] its not found and we need to send a request to them for it.

## 2023-03-03 NOTE — Telephone Encounter (Signed)
Reviewed DEXA in [place], [place] 06/2020. Looks like he saw Dr Altheimer latest 2019. I don't see any recent endo notes.

## 2023-03-15 LAB — HM DEXA SCAN

## 2023-05-10 ENCOUNTER — Other Ambulatory Visit: Payer: Self-pay | Admitting: Family Medicine

## 2023-05-12 NOTE — Telephone Encounter (Signed)
Nystatin crm Last filled:  04/13/23, #30 g Last OV:  02/24/23, CPE Next OV:  none

## 2023-05-12 NOTE — Telephone Encounter (Signed)
ERx 

## 2023-05-17 ENCOUNTER — Other Ambulatory Visit: Payer: Self-pay | Admitting: Family Medicine

## 2023-05-17 NOTE — Telephone Encounter (Signed)
Amoxicillin Last filled:  11/06/22, #4 Last OV:  02/24/23, CPE Next OV:  none

## 2023-09-12 LAB — COLOGUARD: COLOGUARD: NEGATIVE

## 2023-09-21 ENCOUNTER — Telehealth: Payer: Self-pay | Admitting: Family Medicine

## 2023-09-21 MED ORDER — AMOXICILLIN 500 MG PO CAPS: ORAL_CAPSULE | ORAL | 3 refills | Status: AC

## 2023-09-21 NOTE — Telephone Encounter (Signed)
Plz notify I've sent this in for patient.

## 2023-09-21 NOTE — Telephone Encounter (Signed)
Prescription Request  09/21/2023  LOV: 02/24/2023  What is the name of the medication or equipment? amoxicillin (AMOXIL) 500 MG capsule   Have you contacted your pharmacy to request a refill? No   Which pharmacy would you like this sent to?  [id number], [place] - [address] [place] [address] Phone: [phone] Fax: [phone]    Patient notified that their request is being sent to the clinical staff for review and that they should receive a response within 2 business days.   Please advise at Mobile [phone] (mobile)  Patient has a dentist appt tomorrow and wanted to get this refilled, states he accidentally took it today thinking the appt was today. Wanted to get this refilled before he goes tomorrow

## 2023-09-22 NOTE — Telephone Encounter (Signed)
Spoke with pt notifying him rx was sent in. Pt states he picked it up yesterday and expresses his thanks.

## 2023-10-05 ENCOUNTER — Ambulatory Visit (INDEPENDENT_AMBULATORY_CARE_PROVIDER_SITE_OTHER): Payer: Medicare Other

## 2023-10-05 VITALS — Ht 69.0 in | Wt 188.0 lb

## 2023-10-05 DIAGNOSIS — Z0000 Encounter for general adult medical examination without abnormal findings: Secondary | ICD-10-CM | POA: Diagnosis not present

## 2023-10-05 NOTE — Progress Notes (Signed)
Subjective:   Frederick Stephens is a 77 y.o. male who presents for Medicare Annual/Subsequent preventive examination.  Visit Complete: Virtual I connected with  Dala Dock on 10/05/23 by a audio enabled telemedicine application and verified that I am speaking with the correct person using two identifiers.  Patient Location: Home  Provider Location: Office/Clinic  I discussed the limitations of evaluation and management by telemedicine. The patient expressed understanding and agreed to proceed.  Vital Signs: Because this visit was a virtual/telehealth visit, some criteria may be missing or patient reported. Any vitals not documented were not able to be obtained and vitals that have been documented are patient reported.  Patient Medicare AWV questionnaire was completed by the patient on 10/04/23; I have confirmed that all information answered by patient is correct and no changes since this date. Cardiac Risk Factors include: dyslipidemia;hypertension;male gender;sedentary lifestyle    Objective:    Today's Vitals   10/05/23 1306  Weight: 188 lb (85.3 kg)  Height: 5\' 9"  (1.753 m)   Body mass index is 27.76 kg/m.     10/01/2022   11:28 AM 11/07/2015    1:05 AM  Advanced Directives  Does Patient Have a Medical Advance Directive? Yes No  Type of Estate agent of [place];Living will   Copy of Healthcare Power of Attorney in Chart? No - copy requested     Current Medications (verified) Outpatient Encounter Medications as of 10/05/2023  Medication Sig   allopurinol (ZYLOPRIM) 100 MG tablet Take 1.5 tablets (150 mg total) by mouth daily.   amLODipine (NORVASC) 2.5 MG tablet Take 2.5 mg by mouth 2 (two) times daily.   amoxicillin (AMOXIL) 500 MG capsule TAKE 4 CAPSULES BY MOUTH DAILY AS NEEDED (FOR DENTAL PROCEDURES).   aspirin 81 MG tablet Take 81 mg by mouth daily.   Calcium Carb-Cholecalciferol (CALCIUM-VITAMIN D) 600-400 MG-UNIT TABS Take 1 tablet by  mouth daily.   COLCRYS 0.6 MG tablet TAKE 1 TABLET (0.6 MG TOTAL) BY MOUTH 2 (TWO) TIMES DAILY. AS NEEDED GOUT FLARE   Magnesium 400 MG TABS Take 1 tablet by mouth 3 (three) times daily.   mycophenolate (CELLCEPT) 250 MG capsule Take 500 mg by mouth 2 (two) times daily.   nystatin cream (MYCOSTATIN) APPLY TO AFFECTED AREA TWICE A DAY   omeprazole (PRILOSEC) 20 MG capsule Take 1 tablet by mouth daily.   pravastatin (PRAVACHOL) 20 MG tablet Take 20 mg by mouth every evening.   propranolol (INDERAL) 80 MG tablet Take 80 mg by mouth daily.   sildenafil (REVATIO) 20 MG tablet Take 2 tablets (40 mg total) by mouth daily as needed.   tacrolimus (PROGRAF) 0.5 MG capsule Take 0.5 mg by mouth 2 (two) times daily.   hydrocortisone (ANUSOL-HC) 2.5 % rectal cream PLACE 1 APPLICATION RECTALLY 2 (TWO) TIMES DAILY. (Patient not taking: Reported on 02/24/2023)   No facility-administered encounter medications on file as of 10/05/2023.    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   due to heart failure,cardiomyopathy   Past Surgical History:  Procedure Laterality Date   CARDIAC CATHETERIZATION  12/2020   normal coronary arteries - [facility]   COLONOSCOPY  10/2014   [facility]   dobutamine stress test  11/2014   normal   ESOPHAGOGASTRODUODENOSCOPY  07/2018   soft palate polyp (rec f/u ENT), esophageal strictures x2 s/p dilatation, HH ([facility])   HEART  TRANSPLANT  11/29/08   [facility] (Dr Charmian Muff IV)   Family History  Problem Relation Age of Onset   Gout Mother    Hypertension Mother    Heart disease Mother    Gout Brother    Hypertension Brother    Heart disease Father    Diabetes Brother    Cancer Neg Hx    Stroke Neg Hx    Social History   Socioeconomic History   Marital status: Married    Spouse name: Not on file   Number of children: Not on file   Years of education: Not on file   Highest education level: Not on file  Occupational History   Not on file  Tobacco Use   Smoking status: Never   Smokeless tobacco: Never  Substance and Sexual Activity   Alcohol use: No   Drug use: No   Sexual activity: Not on file  Other Topics Concern   Not on file  Social History Narrative   ** Merged History Encounter **       Lives with wife Son lives nearby - as well as extended family in [place]. 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 [employer]: AA Activity: goes to Y 3x/[facility] Diet: some water, fruits/vegetables daily   Social Determinants of Health   Financial Resource Strain: Low Risk  (10/04/2023)   Overall Financial Resource Strain (CARDIA)    Difficulty of Paying Living Expenses: Not very hard  Food Insecurity: No Food Insecurity (10/04/2023)   Hunger Vital Sign    Worried About Running Out of Food in the Last Year: Never true    Ran Out of Food in the Last Year: Never true  Transportation Needs: No Transportation Needs (10/04/2023)   PRAPARE - Administrator, Civil Service (Medical): No    Lack of Transportation (Non-Medical): No  Physical Activity: Insufficiently Active (10/01/2022)   Exercise Vital Sign    Days of Exercise per Week: 2 days    Minutes of Exercise per Session: 60 min  Stress: No Stress Concern Present (10/04/2023)   [employer] of Occupational Health - Occupational Stress Questionnaire    Feeling of Stress : Only a little  Social Connections: Unknown (10/04/2023)    Social Connection and Isolation Panel [NHANES]    Frequency of Communication with Friends and Family: Twice a week    Frequency of Social Gatherings with Friends and Family: Once a week    Attends Religious Services: Not on Marketing executive or Organizations: Yes    Attends Engineer, structural: More than 4 times per year    Marital Status: Married    Tobacco Counseling Counseling given: Not Answered   Clinical Intake:  Pre-visit preparation completed: No  Pain : No/denies pain     BMI - recorded: 27.76 Nutritional Status: BMI 25 -29 Overweight Nutritional Risks: None Diabetes: No  How often do you need to have someone help you when you read  instructions, pamphlets, or other written materials from your doctor or pharmacy?: 1 - Never  Interpreter Needed?: No  Comments: lives with wife Information entered by :: B.Jaleal Schliep,LPN   Activities of Daily Living    10/04/2023    2:01 PM  In your present state of health, do you have any difficulty performing the following activities:  Hearing? 0  Vision? 0  Difficulty concentrating or making decisions? 0  Walking or climbing stairs? 0  Dressing or bathing? 0  Doing errands, shopping? 0  Preparing Food and eating ? N  Using the Toilet? N  In the past six months, have you accidently leaked urine? Y  Do you have problems with loss of bowel control? N  Managing your Medications? N  Managing your Finances? N  Housekeeping or managing your Housekeeping? N    Patient Care Team: Eustaquio Boyden, MD as PCP - General (Family Medicine)  Indicate any recent Medical Services you may have received from other than [facility] providers in the past year (date may be approximate).     Assessment:   This is a routine wellness examination for Frederick Stephens.  Hearing/Vision screen Hearing Screening - Comments:: Pt says his hearing is somewhat decreased but adequate for now Vision Screening - Comments:: Pt says he wears  glasses and vision is adequate [facility]   Goals Addressed   None    Depression Screen    10/05/2023    1:16 PM 02/24/2023   10:14 AM 10/01/2022   11:25 AM 10/19/2018   12:18 PM 08/20/2016    4:19 PM 01/15/2015    3:06 PM  PHQ 2/9 Scores  PHQ - 2 Score 0 1 0 0 2 0  PHQ- 9 Score  4   6     Fall Risk    10/04/2023    2:01 PM 02/24/2023   10:14 AM 10/01/2022   11:27 AM 10/19/2018   12:18 PM 08/20/2016    4:19 PM  Fall Risk   Falls in the past year? 0 0 0 0 No  Number falls in past yr: 0  0    Injury with Fall? 0  0    Risk for fall due to : No Fall Risks  No Fall Risks    Follow up Education provided;Falls prevention discussed  Falls prevention discussed      MEDICARE RISK AT HOME: Medicare Risk at Home Any stairs in or around the home?: Yes If so, are there any without handrails?: No Home free of loose throw rugs in walkways, pet beds, electrical cords, etc?: Yes Adequate lighting in your home to reduce risk of falls?: Yes Life alert?: No Use of a cane, walker or w/c?: No Grab bars in the bathroom?: No Shower chair or bench in shower?: No Elevated toilet seat or a handicapped toilet?: No  TIMED UP AND GO:  Was the test performed?  No    Cognitive Function:        10/05/2023    1:19 PM 10/01/2022   11:28 AM  6CIT Screen  What Year? 0 points 0 points  What month? 0 points 0 points  What time? 0 points 0 points  Count back from 20 0 points 0 points  Months in reverse 0 points 0 points  Repeat phrase 0 points 0 points  Total Score 0 points 0 points    Immunizations Immunization History  Administered Date(s) Administered   Fluad Quad(high Dose 65+) 09/11/2019   Influenza Whole 09/06/2013   Influenza, High Dose Seasonal  PF 09/22/2017, 10/01/2020   Influenza,inj,Quad PF,6+ Mos 08/30/2015, 08/13/2016, 10/19/2018, 09/15/2022   Influenza-Unspecified 09/06/2014   Moderna Covid-19 Fall Seasonal Vaccine 65yrs & older 09/15/2022   Moderna SARS-COV2  Booster Vaccination 09/15/2022   PFIZER Comirnaty(Gray Top)Covid-19 Tri-Sucrose Vaccine 04/29/2021   PFIZER(Purple Top)SARS-COV-2 Vaccination 01/27/2020, 02/20/2020, 08/13/2020   Pneumococcal Conjugate-13 01/15/2015   Pneumococcal Polysaccharide-23 08/20/2016   Respiratory Syncytial Virus Vaccine,Recomb Aduvanted(Arexvy) 01/21/2023   Tdap 11/23/2017, 11/23/2018    TDAP status: Up to date  Flu Vaccine status: Up to date  Pneumococcal vaccine status: Up to date  Covid-19 vaccine status: Completed vaccines  Qualifies for Shingles Vaccine? Yes   Zostavax completed Yes   Shingrix Completed?: Yes  Screening Tests Health Maintenance  Topic Date Due   COVID-19 Vaccine (5 - 2023-24 season) 08/08/2023   Zoster Vaccines- Shingrix (2 of 2) 10/27/2023   Medicare Annual Wellness (AWV)  10/04/2024   Colonoscopy  10/09/2024   DTaP/Tdap/Td (3 - Td or Tdap) 11/23/2028   Pneumonia Vaccine 15+ Years old  Completed   INFLUENZA VACCINE  Completed   Hepatitis C Screening  Completed   HPV VACCINES  Aged Out    Health Maintenance  Health Maintenance Due  Topic Date Due   COVID-19 Vaccine (5 - 2023-24 season) 08/08/2023    Colorectal cancer screening: No longer required.  Pt completed Cologuard on 09/20/23  Lung Cancer Screening: (Low Dose CT Chest recommended if Age 67-80 years, 20 pack-year currently smoking OR have quit w/in 15years.) does not qualify.   Lung Cancer Screening Referral: no  Additional Screening:  Hepatitis C Screening: does not qualify; Completed 02/17/23  Vision Screening: Recommended annual ophthalmology exams for early detection of glaucoma and other disorders of the eye. Is the patient up to date with their annual eye exam?  Yes  Who is the provider or what is the name of the office in which the patient attends annual eye exams? [facility] If pt is not established with a provider, would they like to be referred to a provider to establish care? No .   Dental  Screening: Recommended annual dental exams for proper oral hygiene  Diabetic Foot Exam: n/a  Community Resource Referral / Chronic Care Management: CRR required this visit?  No   CCM required this visit?  No   Plan:    I have personally reviewed and noted the following in the patient's chart:   Medical and social history Use of alcohol, tobacco or illicit drugs  Current medications and supplements including opioid prescriptions. Patient is not currently taking opioid prescriptions. Functional ability and status Nutritional status Physical activity Advanced directives List of other physicians Hospitalizations, surgeries, and ER visits in previous 12 months Vitals Screenings to include cognitive, depression, and falls Referrals and appointments  In addition, I have reviewed and discussed with patient certain preventive protocols, quality metrics, and best practice recommendations. A written personalized care plan for preventive services as well as general preventive health recommendations were provided to patient.    Sue Lush, LPN   [id number]   After Visit Summary: (MyChart) Due to this being a telephonic visit, the after visit summary with patients personalized plan was offered to patient via MyChart   Nurse Notes: The patient states they are doing well and has no concerns or questions at this time. Pt does relay to continue to see his cardiologist every six months.

## 2023-10-05 NOTE — Patient Instructions (Signed)
Mr. Oshel , Thank you for taking time to come for your Medicare Wellness Visit. I appreciate your ongoing commitment to your health goals. Please review the following plan we discussed and let me know if I can assist you in the future.   Referrals/Orders/Follow-Ups/Clinician Recommendations: none  This is a list of the screening recommended for you and due dates:  Health Maintenance  Topic Date Due   COVID-19 Vaccine (5 - 2023-24 season) 08/08/2023   Zoster (Shingles) Vaccine (2 of 2) 10/27/2023   Medicare Annual Wellness Visit  10/04/2024   Colon Cancer Screening  10/09/2024   DTaP/Tdap/Td vaccine (3 - Td or Tdap) 11/23/2028   Pneumonia Vaccine  Completed   Flu Shot  Completed   Hepatitis C Screening  Completed   HPV Vaccine  Aged Out    Advanced directives: (Copy Requested) Please bring a copy of your health care power of attorney and living will to the office to be added to your chart at your convenience.  Next Medicare Annual Wellness Visit scheduled for next year: Yes  10/09/24 @ 11:50am telephone

## 2023-12-21 ENCOUNTER — Other Ambulatory Visit: Payer: Self-pay | Admitting: Family Medicine

## 2023-12-21 DIAGNOSIS — L290 Pruritus ani: Secondary | ICD-10-CM

## 2023-12-21 NOTE — Telephone Encounter (Signed)
 Nystatin crm Last filled:  05/19/23, #30 g Last OV:  02/24/23, CPE Next OV:  none

## 2024-02-17 LAB — AMB RESULTS CONSOLE CBG: Glucose: 126

## 2024-02-17 NOTE — Progress Notes (Signed)
 Pt came to mobile screening event to check BP/Blood Sugar. Pt stated he is active at the [facility]. He has had a heart transplant in 2009. BP 127/79 Blood Sugar 126 (non fasting). Pt stated he had no need for SDOH resources. Provided pt with mobile schedule as he would like to regularly be screened to monitor blood sugar levels.

## 2024-03-15 ENCOUNTER — Ambulatory Visit (INDEPENDENT_AMBULATORY_CARE_PROVIDER_SITE_OTHER): Admitting: Family Medicine

## 2024-03-15 ENCOUNTER — Encounter: Payer: Self-pay | Admitting: Family Medicine

## 2024-03-15 VITALS — BP 124/78 | HR 68 | Temp 98.0°F | Ht 69.0 in | Wt 190.5 lb

## 2024-03-15 DIAGNOSIS — L989 Disorder of the skin and subcutaneous tissue, unspecified: Secondary | ICD-10-CM | POA: Insufficient documentation

## 2024-03-15 DIAGNOSIS — Z941 Heart transplant status: Secondary | ICD-10-CM

## 2024-03-15 DIAGNOSIS — D849 Immunodeficiency, unspecified: Secondary | ICD-10-CM | POA: Diagnosis not present

## 2024-03-15 MED ORDER — MAGNESIUM GLYCINATE ADVANCED 120 MG PO CAPS: 2.0000 | ORAL_CAPSULE | Freq: Two times a day (BID) | ORAL | Status: AC

## 2024-03-15 NOTE — Patient Instructions (Addendum)
 For lesion to left groin - I do recommend getting in to see skin doctor at [facility].  May try over the counter clotrimazole (lotrimin) in case fungal component.  Labs today

## 2024-03-15 NOTE — Assessment & Plan Note (Signed)
 Somewhat concerning lesion to left groin given chronicity of poorly healing wound in setting of chronic prograf and cellcept use.  Start with labwork - CBC, ESR, RPR.  Will refer urgently to dermatology for further evaluation.

## 2024-03-15 NOTE — Assessment & Plan Note (Addendum)
 Update levels - he's been taking mag glycinate 240mg  2 capsules BID.

## 2024-03-15 NOTE — Progress Notes (Signed)
 Ph: [phone] Fax: [phone]   Patient ID: Frederick Stephens, male    DOB: [birth year], 78 y.o.   MRN: [id number]  This visit was conducted in person.  BP 124/78   Pulse 68   Temp 98 F (36.7 C) (Oral)   Ht 5\' 9"  (1.753 m)   Wt 190 lb 8 oz (86.4 kg)   SpO2 95%   BMI 28.13 kg/m    CC: skin rash  Subjective:   HPI: Frederick Stephens is a 78 y.o. male presenting on 03/15/2024 for Skin Problem (C/o groin itch more than 1 yr. Tried OTC meds, not helpful. )   Lesion "blackhead" to left groin present for over a year. At times itchy, treated with OTC neosporin with limited benefit. Becoming more bothersome - more itchy, affecting sleep. Doesn't think this is larger. No fevers/chills, no drainage or bleeding from lesion.   He does see dermatologist at [facility] yearly for chronic cellcept/prograf use after heart transplant.   No new sexual contacts.  No dysuria.   Requests magnesium rechecked as recent levels low.      Relevant past medical, surgical, family and social history reviewed and updated as indicated. Interim medical history since our last visit reviewed. Allergies and medications reviewed and updated. Outpatient Medications Prior to Visit  Medication Sig Dispense Refill   allopurinol (ZYLOPRIM) 100 MG tablet Take 1.5 tablets (150 mg total) by mouth daily. 135 tablet 1   amLODipine (NORVASC) 2.5 MG tablet Take 2.5 mg by mouth 2 (two) times daily.     amoxicillin (AMOXIL) 500 MG capsule TAKE 4 CAPSULES BY MOUTH DAILY AS NEEDED (FOR DENTAL PROCEDURES). 4 capsule 3   aspirin 81 MG tablet Take 81 mg by mouth daily.     Calcium Carb-Cholecalciferol (CALCIUM-VITAMIN D) 600-400 MG-UNIT TABS Take 1 tablet by mouth daily.     COLCRYS 0.6 MG tablet TAKE 1 TABLET (0.6 MG TOTAL) BY MOUTH 2 (TWO) TIMES DAILY. AS NEEDED GOUT FLARE 30 tablet 2   hydrocortisone (ANUSOL-HC) 2.5 % rectal cream PLACE 1 APPLICATION RECTALLY 2 (TWO) TIMES DAILY. 30 g 0   nystatin cream (MYCOSTATIN) APPLY TO  AFFECTED AREA TWICE A DAY 30 g 0   omeprazole (PRILOSEC) 20 MG capsule Take 1 tablet by mouth daily.     rosuvastatin (CRESTOR) 20 MG tablet Take 20 mg by mouth daily.     sildenafil (REVATIO) 20 MG tablet Take 2 tablets (40 mg total) by mouth daily as needed. 30 tablet 11   Magnesium Bisglycinate (MAG GLYCINATE) 100 MG TABS Take by mouth.     mycophenolate (CELLCEPT) 250 MG capsule Take 500 mg by mouth 2 (two) times daily.     propranolol (INDERAL) 80 MG tablet Take 80 mg by mouth daily.     tacrolimus (PROGRAF) 0.5 MG capsule Take 0.5 mg by mouth 2 (two) times daily.     mycophenolate (CELLCEPT) 250 MG capsule Take 2 capsules (500 mg total) by mouth 2 (two) times daily. To prevent organ rejection     propranolol (INDERAL) 80 MG tablet Take 1 tablet (80 mg total) by mouth 2 (two) times daily.     tacrolimus (PROGRAF) 0.5 MG capsule Take 1 capsule (0.5 mg total) by mouth 2 (two) times daily. On MWF takes only one pill - To prevent organ rejection     Magnesium 400 MG TABS Take 1 tablet by mouth 3 (three) times daily.     pravastatin (PRAVACHOL) 20 MG tablet Take 20 mg by mouth every  evening.     No facility-administered medications prior to visit.     Per HPI unless specifically indicated in ROS section below Review of Systems  Objective:  BP 124/78   Pulse 68   Temp 98 F (36.7 C) (Oral)   Ht 5\' 9"  (1.753 m)   Wt 190 lb 8 oz (86.4 kg)   SpO2 95%   BMI 28.13 kg/m   Wt Readings from Last 3 Encounters:  03/15/24 190 lb 8 oz (86.4 kg)  10/05/23 188 lb (85.3 kg)  02/24/23 188 lb 8 oz (85.5 kg)      Physical Exam Vitals and nursing note reviewed.  Constitutional:      Appearance: Normal appearance. He is not ill-appearing.  Abdominal:     Hernia: There is no hernia in the left inguinal area or right inguinal area.  Genitourinary:    Pubic Area: No rash.      Penis: Normal.      Testes: Normal.        Right: Mass, tenderness or swelling not present.        Left: Mass,  tenderness or swelling not present.       Comments: Chronic sore with surrounding indurated skin to left groin adjacent to scrotum  Lymphadenopathy:     Lower Body: No right inguinal adenopathy. No left inguinal adenopathy.  Skin:    Findings: Lesion present.  Neurological:     Mental Status: He is alert.  Psychiatric:        Mood and Affect: Mood normal.        Behavior: Behavior normal.        Assessment & Plan:   Problem List Items Addressed This Visit     H/O heart transplant (HCC)   Relevant Medications   tacrolimus (PROGRAF) 0.5 MG capsule   mycophenolate (CELLCEPT) 250 MG capsule   Hypomagnesemia   Update levels - he's been taking mag glycinate 240mg  2 capsules BID.       Relevant Orders   Magnesium   Immunocompromised state (HCC)   Skin lesion - Primary   Somewhat concerning lesion to left groin given chronicity of poorly healing wound in setting of chronic prograf and cellcept use.  Start with labwork - CBC, ESR, RPR.  Will refer urgently to dermatology for further evaluation.      Relevant Orders   Ambulatory referral to Dermatology   CBC with Differential/Platelet   Sedimentation rate   RPR     Meds ordered this encounter  Medications   Magnesium Glycinate (MAGNESIUM GLYCINATE ADVANCED) 120 MG CAPS    Sig: Take 2 capsules by mouth in the morning and at bedtime.    Orders Placed This Encounter  Procedures   CBC with Differential/Platelet   Sedimentation rate   RPR   Magnesium   Ambulatory referral to Dermatology    Referral Priority:   Urgent    Referral Type:   Consultation    Referral Reason:   Specialty Services Required    Requested Specialty:   Dermatology    Number of Visits Requested:   1    Patient Instructions  For lesion to left groin - I do recommend getting in to see skin doctor at [facility].  May try over the counter clotrimazole (lotrimin) in case fungal component.  Labs today   Follow up plan: Return if symptoms worsen or  fail to improve.  Eustaquio Boyden, MD

## 2024-03-16 ENCOUNTER — Encounter: Payer: Self-pay | Admitting: Family Medicine

## 2024-03-16 LAB — CBC WITH DIFFERENTIAL/PLATELET
Basophils Absolute: 0 10*3/uL (ref 0.0–0.1)
Basophils Relative: 0.5 % (ref 0.0–3.0)
Eosinophils Absolute: 0.1 10*3/uL (ref 0.0–0.7)
Eosinophils Relative: 2.4 % (ref 0.0–5.0)
HCT: 42.4 % (ref 39.0–52.0)
Hemoglobin: 13.8 g/dL (ref 13.0–17.0)
Lymphocytes Relative: 45.4 % (ref 12.0–46.0)
Lymphs Abs: 2 10*3/uL (ref 0.7–4.0)
MCHC: 32.6 g/dL (ref 30.0–36.0)
MCV: 95.2 fl (ref 78.0–100.0)
Monocytes Absolute: 0.5 10*3/uL (ref 0.1–1.0)
Monocytes Relative: 11.8 % (ref 3.0–12.0)
Neutro Abs: 1.8 10*3/uL (ref 1.4–7.7)
Neutrophils Relative %: 39.9 % — ABNORMAL LOW (ref 43.0–77.0)
Platelets: 194 10*3/uL (ref 150.0–400.0)
RBC: 4.46 Mil/uL (ref 4.22–5.81)
RDW: 13.9 % (ref 11.5–15.5)
WBC: 4.5 10*3/uL (ref 4.0–10.5)

## 2024-03-16 LAB — SEDIMENTATION RATE: Sed Rate: 14 mm/h (ref 0–20)

## 2024-03-16 LAB — RPR: RPR Ser Ql: NONREACTIVE

## 2024-03-16 LAB — MAGNESIUM: Magnesium: 2 mg/dL (ref 1.5–2.5)

## 2024-03-28 NOTE — Progress Notes (Signed)
 The patient attended a screening event on 02/17/2024, where his blood pressure was measured at 127/79 mmHg, and his blood glucose was 126 mg/dl. During the event, the patient reported that he does not smoke, has [employer], is established with a primary care provider ([facility]?), and did not indicate any SDOH needs at the event.  A chart review indicates that the pt has Dr. Claire Crick - [facility] as his listed PCP. Chart review indicates that Dr. Doroteo Gasmen with [facility] and encounters indicates that she has completed his medication refills, this provider is a part of his care team.  The patient's most recent office visit with Dr. Mariam Shingles was on 03/15/2024 and a follow-up appointment with his PCP scheduled for 12:30 PM. He also has an appt on 05/31/24 at 10 am and on 10/09/24 at 10:50 am for an annual exam at the same clinic.  The patient demonstrates consistent engagement in his care.   At this time, no additional support from the [facility] Team is indicated at this time.

## 2024-04-19 ENCOUNTER — Telehealth: Payer: Self-pay | Admitting: Family Medicine

## 2024-04-19 NOTE — Telephone Encounter (Signed)
 I still recommend dermatology evaluation for abnormal skin lesion.  He can either see local derm or return to his [facility] dermatologist (Arcola Beards NP).

## 2024-04-19 NOTE — Telephone Encounter (Signed)
 Spoke with pt relaying Dr Ocie Belt message. Pt verbalizes understanding and will contact [facility] derm since he's already established with them and they know his hx. Fyi to Dr Crissie Dome.

## 2024-04-19 NOTE — Telephone Encounter (Signed)
-----   Message from Amy B sent at 04/12/2024 11:14 AM EDT ----- Regarding: DERM REFERRAL Patient would like to inform provider that he has been scheduled for an appointment with [facility] but will be cancelling appointment because medication prescribed is working and patient feels appointment is no longer necessary.

## 2024-05-28 ENCOUNTER — Other Ambulatory Visit: Payer: Self-pay | Admitting: Family Medicine

## 2024-05-28 DIAGNOSIS — Z125 Encounter for screening for malignant neoplasm of prostate: Secondary | ICD-10-CM

## 2024-05-28 DIAGNOSIS — E559 Vitamin D deficiency, unspecified: Secondary | ICD-10-CM

## 2024-05-28 DIAGNOSIS — E785 Hyperlipidemia, unspecified: Secondary | ICD-10-CM

## 2024-05-28 DIAGNOSIS — M1A00X0 Idiopathic chronic gout, unspecified site, without tophus (tophi): Secondary | ICD-10-CM

## 2024-05-28 DIAGNOSIS — D849 Immunodeficiency, unspecified: Secondary | ICD-10-CM

## 2024-05-31 ENCOUNTER — Other Ambulatory Visit (INDEPENDENT_AMBULATORY_CARE_PROVIDER_SITE_OTHER)

## 2024-05-31 DIAGNOSIS — M1A00X0 Idiopathic chronic gout, unspecified site, without tophus (tophi): Secondary | ICD-10-CM

## 2024-05-31 DIAGNOSIS — Z125 Encounter for screening for malignant neoplasm of prostate: Secondary | ICD-10-CM

## 2024-05-31 DIAGNOSIS — E559 Vitamin D deficiency, unspecified: Secondary | ICD-10-CM

## 2024-05-31 DIAGNOSIS — E785 Hyperlipidemia, unspecified: Secondary | ICD-10-CM

## 2024-05-31 DIAGNOSIS — D849 Immunodeficiency, unspecified: Secondary | ICD-10-CM | POA: Diagnosis not present

## 2024-05-31 LAB — CBC WITH DIFFERENTIAL/PLATELET
Basophils Absolute: 0 10*3/uL (ref 0.0–0.1)
Basophils Relative: 0.4 % (ref 0.0–3.0)
Eosinophils Absolute: 0.1 10*3/uL (ref 0.0–0.7)
Eosinophils Relative: 1.9 % (ref 0.0–5.0)
HCT: 42.5 % (ref 39.0–52.0)
Hemoglobin: 13.9 g/dL (ref 13.0–17.0)
Lymphocytes Relative: 37.7 % (ref 12.0–46.0)
Lymphs Abs: 1.6 10*3/uL (ref 0.7–4.0)
MCHC: 32.7 g/dL (ref 30.0–36.0)
MCV: 93 fl (ref 78.0–100.0)
Monocytes Absolute: 0.4 10*3/uL (ref 0.1–1.0)
Monocytes Relative: 10 % (ref 3.0–12.0)
Neutro Abs: 2.1 10*3/uL (ref 1.4–7.7)
Neutrophils Relative %: 50 % (ref 43.0–77.0)
Platelets: 184 10*3/uL (ref 150.0–400.0)
RBC: 4.57 Mil/uL (ref 4.22–5.81)
RDW: 14 % (ref 11.5–15.5)
WBC: 4.2 10*3/uL (ref 4.0–10.5)

## 2024-05-31 LAB — LIPID PANEL
Cholesterol: 138 mg/dL (ref 0–200)
HDL: 40.2 mg/dL (ref >39.00)
LDL Cholesterol: 75 mg/dL (ref 0–99)
NonHDL: 97.92
Total CHOL/HDL Ratio: 3
Triglycerides: 115 mg/dL (ref 0.0–149.0)
VLDL: 23 mg/dL (ref 0.0–40.0)

## 2024-05-31 LAB — COMPREHENSIVE METABOLIC PANEL WITH GFR
ALT: 12 U/L (ref 0–53)
AST: 13 U/L (ref 0–37)
Albumin: 4.2 g/dL (ref 3.5–5.2)
Alkaline Phosphatase: 66 U/L (ref 39–117)
BUN: 14 mg/dL (ref 6–23)
CO2: 28 meq/L (ref 19–32)
Calcium: 9.2 mg/dL (ref 8.4–10.5)
Chloride: 108 meq/L (ref 96–112)
Creatinine, Ser: 0.97 mg/dL (ref 0.40–1.50)
GFR: 75.84 mL/min (ref >60.00)
Glucose, Bld: 105 mg/dL — ABNORMAL HIGH (ref 70–99)
Potassium: 4.2 meq/L (ref 3.5–5.1)
Sodium: 143 meq/L (ref 135–145)
Total Bilirubin: 0.6 mg/dL (ref 0.2–1.2)
Total Protein: 6.7 g/dL (ref 6.0–8.3)

## 2024-05-31 LAB — PSA: PSA: 2.88 ng/mL (ref 0.10–4.00)

## 2024-05-31 LAB — URIC ACID: Uric Acid, Serum: 4.3 mg/dL (ref 4.0–7.8)

## 2024-05-31 LAB — MAGNESIUM: Magnesium: 1.9 mg/dL (ref 1.5–2.5)

## 2024-05-31 LAB — TSH: TSH: 2.15 u[IU]/mL (ref 0.35–5.50)

## 2024-05-31 LAB — VITAMIN D 25 HYDROXY (VIT D DEFICIENCY, FRACTURES): VITD: 54.31 ng/mL (ref 30.00–100.00)

## 2024-06-02 ENCOUNTER — Ambulatory Visit: Payer: Self-pay | Admitting: Family Medicine

## 2024-06-07 ENCOUNTER — Other Ambulatory Visit (HOSPITAL_COMMUNITY): Payer: Self-pay

## 2024-06-07 ENCOUNTER — Telehealth: Payer: Self-pay

## 2024-06-07 ENCOUNTER — Ambulatory Visit: Admitting: Family Medicine

## 2024-06-07 VITALS — BP 118/76 | HR 74 | Temp 97.8°F | Ht 68.25 in | Wt 187.2 lb

## 2024-06-07 DIAGNOSIS — D849 Immunodeficiency, unspecified: Secondary | ICD-10-CM

## 2024-06-07 DIAGNOSIS — M810 Age-related osteoporosis without current pathological fracture: Secondary | ICD-10-CM

## 2024-06-07 DIAGNOSIS — Z639 Problem related to primary support group, unspecified: Secondary | ICD-10-CM

## 2024-06-07 DIAGNOSIS — Z0000 Encounter for general adult medical examination without abnormal findings: Secondary | ICD-10-CM | POA: Diagnosis not present

## 2024-06-07 DIAGNOSIS — I10 Essential (primary) hypertension: Secondary | ICD-10-CM

## 2024-06-07 DIAGNOSIS — G250 Essential tremor: Secondary | ICD-10-CM

## 2024-06-07 DIAGNOSIS — N529 Male erectile dysfunction, unspecified: Secondary | ICD-10-CM | POA: Diagnosis not present

## 2024-06-07 DIAGNOSIS — M1A00X0 Idiopathic chronic gout, unspecified site, without tophus (tophi): Secondary | ICD-10-CM | POA: Diagnosis not present

## 2024-06-07 DIAGNOSIS — L989 Disorder of the skin and subcutaneous tissue, unspecified: Secondary | ICD-10-CM

## 2024-06-07 DIAGNOSIS — Z7189 Other specified counseling: Secondary | ICD-10-CM | POA: Diagnosis not present

## 2024-06-07 DIAGNOSIS — E785 Hyperlipidemia, unspecified: Secondary | ICD-10-CM

## 2024-06-07 DIAGNOSIS — E559 Vitamin D deficiency, unspecified: Secondary | ICD-10-CM

## 2024-06-07 DIAGNOSIS — N400 Enlarged prostate without lower urinary tract symptoms: Secondary | ICD-10-CM

## 2024-06-07 DIAGNOSIS — Z941 Heart transplant status: Secondary | ICD-10-CM

## 2024-06-07 MED ORDER — COLCRYS 0.6 MG PO TABS: ORAL_TABLET | ORAL | 2 refills | Status: DC

## 2024-06-07 MED ORDER — ALLOPURINOL 100 MG PO TABS: 150.0000 mg | ORAL_TABLET | Freq: Every day | ORAL | 4 refills | Status: AC

## 2024-06-07 MED ORDER — COLCHICINE 0.6 MG PO TABS: ORAL_TABLET | ORAL | 2 refills | Status: AC

## 2024-06-07 MED ORDER — SILDENAFIL CITRATE 20 MG PO TABS: 40.0000 mg | ORAL_TABLET | Freq: Every day | ORAL | 6 refills | Status: AC | PRN

## 2024-06-07 NOTE — Assessment & Plan Note (Addendum)
 Stressful period at home. Support provided. To consider individual vs couples counseling

## 2024-06-07 NOTE — Assessment & Plan Note (Signed)
 Managed with propranolol 80mg  bid through heart transplant team.

## 2024-06-07 NOTE — Assessment & Plan Note (Signed)
 Stable period. Discussed stopping prostate cancer screening

## 2024-06-07 NOTE — Assessment & Plan Note (Signed)
 This is followed by [facility]  Continue calcium /vitamin d  supplement.

## 2024-06-07 NOTE — Telephone Encounter (Signed)
 Pharmacy Patient Advocate Encounter   Received notification from Onbase that prior authorization for Colcrys  0.6 mg tablets is required/requested.   Insurance verification completed.   The patient is insured through [facility] .   Per test claim:  Colchicine  tablets is preferred by the insurance.  If suggested medication is appropriate, Please send in a new RX and discontinue this one. If not, please advise as to why it's not appropriate so that we may request a Prior Authorization. Please note, some preferred medications may still require a PA.  If the suggested medications have not been trialed and there are no contraindications to their use, the PA will not be submitted, as it will not be approved.  *this medication was put in as a DAW 1, was that correct? The generic will not need a prior auth and the copay via a test claim is $108.26 which is contributing to his deductible.

## 2024-06-07 NOTE — Telephone Encounter (Signed)
 Pharmacy Patient Advocate Encounter  Received notification from [facility] that Prior Authorization for Sildenafil  Citrate (PAH) 20MG  tablets  has been DENIED.  Full denial letter will be uploaded to the media tab. See denial reason below.   PA #/Case ID/Reference #: [id number]

## 2024-06-07 NOTE — Assessment & Plan Note (Signed)
 This has largely fallen off - most consistent with irritated seborrheic keratosis.  Discussed care at home for mild residual ulceration - rec topical abx until full resolution.

## 2024-06-07 NOTE — Assessment & Plan Note (Addendum)
 Advanced planning: has living will at home. HCPOA is wife Carletta. Full code. Asked to bring me copy.

## 2024-06-07 NOTE — Assessment & Plan Note (Addendum)
 Refilled sildenafil  to use PRN

## 2024-06-07 NOTE — Telephone Encounter (Signed)
 Noted. Sildenafil  20mg  is never covered for ED so no need to send for PA request in the future. Pt will need to pay for this out of pocket.

## 2024-06-07 NOTE — Telephone Encounter (Signed)
 New Rx for generic sent in. Thanks.

## 2024-06-07 NOTE — Assessment & Plan Note (Signed)
Chronic, stable period on current regimen - continue.

## 2024-06-07 NOTE — Assessment & Plan Note (Signed)
 Well controlled - continue cal/vit D daily.

## 2024-06-07 NOTE — Assessment & Plan Note (Addendum)
 Continues allopurinol  150mg  daily. No recent gout flare

## 2024-06-07 NOTE — Assessment & Plan Note (Signed)
 Chronic, stable period on rosuvastatin - continue. The 10-year ASCVD risk score (Arnett DK, et al., 2019) is: 19%   Values used to calculate the score:     Age: 78 years     Clincally relevant sex: Male     Is Non-Hispanic African American: Yes     Diabetic: No     Tobacco smoker: No     Systolic Blood Pressure: 118 mmHg     Is BP treated: Yes     HDL Cholesterol: 40.2 mg/dL     Total Cholesterol: 138 mg/dL

## 2024-06-07 NOTE — Progress Notes (Signed)
 Ph: [phone] Fax: [phone]   Patient ID: Frederick Stephens, male    DOB: 1946-10-25, 78 y.o.   MRN: [id number]  This visit was conducted in person.  BP 118/76   Pulse 74   Temp 97.8 F (36.6 C) (Oral)   Ht 5' 8.25 (1.734 m)   Wt 187 lb 4 oz (84.9 kg)   SpO2 95%   BMI 28.26 kg/m    CC: CPE/AMW Subjective:   HPI: Frederick Stephens is a 78 y.o. male presenting on 06/07/2024 for Annual Exam (MCR prt 2 [AWV- 09/30/23].)   Saw health advisor last 09/2023.   No results found.  Flowsheet Row Office Visit from 06/07/2024 in [facility] [place]  PHQ-2 Total Score 1       06/07/2024   12:26 PM 03/15/2024    2:06 PM 10/04/2023    2:01 PM 02/24/2023   10:14 AM 10/01/2022   11:27 AM  Fall Risk   Falls in the past year? 0 0 0 0 0  Number falls in past yr:   0  0  Injury with Fall?   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 Regularly sees heart transplant clinic. Continues prograf and cellcept.   Chronic intention tremor treating with propranolol 80mg  BID. Notes ongoing trouble with writing. L>R tremor, R handed. + fm essential tremor  Has not yet gotten derm appt scheduled to evaluate abnormal growth to groin.   Notes soreness to R lower back that started last week. Started Saturday after lifting heavy flower pots.    Preventative: Colonoscopy - normal 10/2014 at [facility] normal 09/2023 Prostate - has not seen urologist since COVID. H/o elevated PSA but more recently all normal readings. H/o BPH. Discussed aging out Lung cancer screen - not eligible  DEXA unsure date - T -2.5 hip (osteoporosis) s/p 41yrs alendronate. Has seen endocrinology.  Rpt DEXA 12/2017 - osteoporosis T score of -2.5 (hip) consider rpt 2 yrs.  Continues calcium/vit D regularly.  He states he saw endocrinology 2023 - will request records.  Flu shot - yearly Prevnar 2016. Pneumovax 08/2016 Tdap - 11/2018 RSV - 01/2023  Shingrix - 08/2023 at [facility], has not completed 2nd Advanced planning: has living will at home. HCPOA is wife Carletta. Full code. Asked to bring me copy.  Seat belt use discussed Sunscreen use discussed. No changing moles on skin. Sees derm yearly - due for f/u. Non smoker Alcohol - none Dentist q6 mo  Eye exam goes every 1-2 years  Bowel - no constipation Bladder - no incontinence  Lives with wife (2018) Son lives nearby - as well as extended family in [place]. 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 Interim medical history since our last visit reviewed. Allergies and medications reviewed and updated. Outpatient Medications Prior to Visit  Medication Sig Dispense Refill   amLODipine (NORVASC) 2.5 MG tablet Take 2.5 mg by mouth 2 (two) times daily.     amoxicillin  (AMOXIL ) 500 MG capsule TAKE 4 CAPSULES BY MOUTH DAILY AS NEEDED (FOR  DENTAL PROCEDURES). 4 capsule 3   aspirin 81 MG tablet Take 81 mg by mouth daily.     Calcium Carb-Cholecalciferol (CALCIUM-VITAMIN D ) 600-400 MG-UNIT TABS Take 1 tablet by mouth daily.     hydrocortisone  (ANUSOL -HC) 2.5 % rectal cream PLACE 1 APPLICATION RECTALLY 2 (TWO) TIMES DAILY. 30 g 0   Magnesium  Glycinate (MAGNESIUM  GLYCINATE ADVANCED) 120 MG CAPS Take 2 capsules by mouth in the morning and at bedtime.     mycophenolate (CELLCEPT) 250 MG capsule Take 2 capsules (500 mg total) by mouth 2 (two) times daily. To prevent organ rejection     nystatin  cream (MYCOSTATIN ) APPLY TO AFFECTED AREA TWICE A DAY 30 g 0   omeprazole (PRILOSEC) 20 MG capsule Take 1 tablet by mouth daily.     propranolol (INDERAL)  80 MG tablet Take 1 tablet (80 mg total) by mouth 2 (two) times daily.     rosuvastatin (CRESTOR) 20 MG tablet Take 20 mg by mouth daily.     tacrolimus (PROGRAF) 0.5 MG capsule Take 1 capsule (0.5 mg total) by mouth 2 (two) times daily. On MWF takes only one pill - To prevent organ rejection     allopurinol  (ZYLOPRIM ) 100 MG tablet Take 1.5 tablets (150 mg total) by mouth daily. 135 tablet 1   COLCRYS  0.6 MG tablet TAKE 1 TABLET (0.6 MG TOTAL) BY MOUTH 2 (TWO) TIMES DAILY. AS NEEDED GOUT FLARE 30 tablet 2   sildenafil  (REVATIO ) 20 MG tablet Take 2 tablets (40 mg total) by mouth daily as needed. 30 tablet 11   No facility-administered medications prior to visit.     Per HPI unless specifically indicated in ROS section below Review of Systems  Constitutional:  Negative for activity change, appetite change, chills, fatigue, fever and unexpected weight change.  HENT:  Negative for hearing loss.   Eyes:  Negative for visual disturbance.  Respiratory:  Negative for cough, chest tightness, shortness of breath and wheezing.   Cardiovascular:  Negative for chest pain, palpitations and leg swelling.  Gastrointestinal:  Negative for abdominal distention, abdominal pain, blood in stool, constipation, diarrhea, nausea and vomiting.  Genitourinary:  Negative for difficulty urinating and hematuria.  Musculoskeletal:  Negative for arthralgias, myalgias and neck pain.  Skin:  Negative for rash.  Neurological:  Negative for dizziness, seizures, syncope and headaches.  Hematological:  Negative for adenopathy. Does not bruise/bleed easily.  Psychiatric/Behavioral:  Positive for dysphoric mood. The patient is not nervous/anxious.     Objective:  BP 118/76   Pulse 74   Temp 97.8 F (36.6 C) (Oral)   Ht 5' 8.25 (1.734 m)   Wt 187 lb 4 oz (84.9 kg)   SpO2 95%   BMI 28.26 kg/m   Wt Readings from Last 3 Encounters:  06/07/24 187 lb 4 oz (84.9 kg)  03/15/24 190 lb 8 oz (86.4 kg)  10/05/23 188 lb (85.3  kg)      Physical Exam Vitals and nursing note reviewed.  Constitutional:      General: He is not in acute distress.    Appearance: Normal appearance. He is well-developed. He is not ill-appearing.  HENT:     Head: Normocephalic and atraumatic.     Right Ear: Hearing, tympanic membrane, ear canal and external ear normal.     Left Ear: Hearing, tympanic membrane, ear canal and external ear normal.  Eyes:     General: No scleral icterus.    Extraocular Movements: Extraocular movements intact.     Conjunctiva/sclera: Conjunctivae  normal.     Pupils: Pupils are equal, round, and reactive to light.  Neck:     Thyroid : No thyroid  mass or thyromegaly.  Cardiovascular:     Rate and Rhythm: Normal rate and regular rhythm.     Pulses: Normal pulses.          Radial pulses are 2+ on the right side and 2+ on the left side.     Heart sounds: Normal heart sounds. No murmur heard. Pulmonary:     Effort: Pulmonary effort is normal. No respiratory distress.     Breath sounds: Normal breath sounds. No wheezing, rhonchi or rales.  Abdominal:     General: Bowel sounds are normal. There is no distension.     Palpations: Abdomen is soft. There is no mass.     Tenderness: There is no abdominal tenderness. There is no guarding or rebound.     Hernia: No hernia is present.  Musculoskeletal:        General: Normal range of motion.     Cervical back: Normal range of motion and neck supple.     Right lower leg: No edema.     Left lower leg: No edema.  Lymphadenopathy:     Cervical: No cervical adenopathy.  Skin:    General: Skin is warm and dry.     Findings: No rash.  Neurological:     General: No focal deficit present.     Mental Status: He is alert and oriented to person, place, and time.  Psychiatric:        Mood and Affect: Mood normal.        Behavior: Behavior normal.        Thought Content: Thought content normal.        Judgment: Judgment normal.       Results for orders placed or  performed in visit on 05/31/24  Uric acid   Collection Time: 05/31/24  9:54 AM  Result Value Ref Range   Uric Acid, Serum 4.3 4.0 - 7.8 mg/dL  TSH   Collection Time: 05/31/24  9:54 AM  Result Value Ref Range   TSH 2.15 0.35 - 5.50 uIU/mL  PSA   Collection Time: 05/31/24  9:54 AM  Result Value Ref Range   PSA 2.88 0.10 - 4.00 ng/mL  CBC with Differential/Platelet   Collection Time: 05/31/24  9:54 AM  Result Value Ref Range   WBC 4.2 4.0 - 10.5 K/uL   RBC 4.57 4.22 - 5.81 Mil/uL   Hemoglobin 13.9 13.0 - 17.0 g/dL   HCT 57.4 60.9 - 47.9 %   MCV 93.0 78.0 - 100.0 fl   MCHC 32.7 30.0 - 36.0 g/dL   RDW 85.9 88.4 - 84.4 %   Platelets 184.0 150.0 - 400.0 K/uL   Neutrophils Relative % 50.0 43.0 - 77.0 %   Lymphocytes Relative 37.7 12.0 - 46.0 %   Monocytes Relative 10.0 3.0 - 12.0 %   Eosinophils Relative 1.9 0.0 - 5.0 %   Basophils Relative 0.4 0.0 - 3.0 %   Neutro Abs 2.1 1.4 - 7.7 K/uL   Lymphs Abs 1.6 0.7 - 4.0 K/uL   Monocytes Absolute 0.4 0.1 - 1.0 K/uL   Eosinophils Absolute 0.1 0.0 - 0.7 K/uL   Basophils Absolute 0.0 0.0 - 0.1 K/uL  Magnesium    Collection Time: 05/31/24  9:54 AM  Result Value Ref Range   Magnesium  1.9 1.5 - 2.5 mg/dL  VITAMIN D  25 Hydroxy (Vit-D Deficiency, Fractures)  Collection Time: 05/31/24  9:54 AM  Result Value Ref Range   VITD 54.31 30.00 - 100.00 ng/mL  Comprehensive metabolic panel with GFR   Collection Time: 05/31/24  9:54 AM  Result Value Ref Range   Sodium 143 135 - 145 mEq/L   Potassium 4.2 3.5 - 5.1 mEq/L   Chloride 108 96 - 112 mEq/L   CO2 28 19 - 32 mEq/L   Glucose, Bld 105 (H) 70 - 99 mg/dL   BUN 14 6 - 23 mg/dL   Creatinine, Ser 9.02 0.40 - 1.50 mg/dL   Total Bilirubin 0.6 0.2 - 1.2 mg/dL   Alkaline Phosphatase 66 39 - 117 U/L   AST 13 0 - 37 U/L   ALT 12 0 - 53 U/L   Total Protein 6.7 6.0 - 8.3 g/dL   Albumin 4.2 3.5 - 5.2 g/dL   GFR 24.15 >39.99 mL/min   Calcium 9.2 8.4 - 10.5 mg/dL  Lipid panel   Collection Time:  05/31/24  9:54 AM  Result Value Ref Range   Cholesterol 138 0 - 200 mg/dL   Triglycerides 884.9 0.0 - 149.0 mg/dL   HDL 59.79 >60.99 mg/dL   VLDL 76.9 0.0 - 59.9 mg/dL   LDL Cholesterol 75 0 - 99 mg/dL   Total CHOL/HDL Ratio 3    NonHDL 97.92       06/07/2024   12:26 PM 03/15/2024    2:06 PM 10/05/2023    1:16 PM 02/24/2023   10:14 AM 10/01/2022   11:25 AM  Depression screen PHQ 2/9  Decreased Interest 0 1 0 1 0  Down, Depressed, Hopeless 1 0 0 0 0  PHQ - 2 Score 1 1 0 1 0  Altered sleeping 1 2  2    Tired, decreased energy 1 2  1    Change in appetite 0 0  0   Feeling bad or failure about yourself  1 0  0   Trouble concentrating 0 0  0   Moving slowly or fidgety/restless 0 0  0   Suicidal thoughts 0 0  0   PHQ-9 Score 4 5  4    Difficult doing work/chores Not difficult at all Not difficult at all  Not difficult at all        06/07/2024   12:26 PM 03/15/2024    2:07 PM 02/24/2023   10:14 AM  GAD 7 : Generalized Anxiety Score  Nervous, Anxious, on Edge 1 0 0  Control/stop worrying 0 0 0  Worry too much - different things 0 0 0  Trouble relaxing 0 0 1  Restless 0 0 0  Easily annoyed or irritable 0 1 0  Afraid - awful might happen 0 0 0  Total GAD 7 Score 1 1 1   Anxiety Difficulty Not difficult at all Not difficult at all Not difficult at all   Assessment & Plan:   Problem List Items Addressed This Visit     Advanced care planning/counseling discussion (Chronic)   Advanced planning: has living will at home. HCPOA is wife Carletta. Full code. Asked to bring me copy.       Health maintenance examination - Primary (Chronic)   Preventative protocols reviewed and updated unless pt declined. Discussed healthy diet and lifestyle.       HTN (hypertension)   Chronic, stable period on current regimen - continue      Relevant Medications   sildenafil  (REVATIO ) 20 MG tablet   Gout   Continues allopurinol  150mg  daily. No  recent gout flare      Relevant Medications    allopurinol  (ZYLOPRIM ) 100 MG tablet   COLCRYS  0.6 MG tablet   H/O heart transplant (HCC)   Essential tremor   Managed with propranolol 80mg  bid through heart transplant team.       Osteoporosis   This is followed by [facility]  Continue calcium /vitamin d  supplement.       BPH (benign prostatic hyperplasia)   Stable period. Discussed stopping prostate cancer screening       Vitamin D  deficiency   Well controlled - continue cal/vit D daily.       Erectile dysfunction   Refilled sildenafil  to use PRN      Relevant Medications   sildenafil  (REVATIO ) 20 MG tablet   HLD (hyperlipidemia)   Chronic, stable period on rosuvastatin - continue. The 10-year ASCVD risk score (Arnett DK, et al., 2019) is: 19%   Values used to calculate the score:     Age: 60 years     Clincally relevant sex: Male     Is Non-Hispanic African American: Yes     Diabetic: No     Tobacco smoker: No     Systolic Blood Pressure: 118 mmHg     Is BP treated: Yes     HDL Cholesterol: 40.2 mg/dL     Total Cholesterol: 138 mg/dL       Relevant Medications   sildenafil  (REVATIO ) 20 MG tablet   Immunocompromised state (HCC)   Skin lesion   This has largely fallen off - most consistent with irritated seborrheic keratosis.  Discussed care at home for mild residual ulceration - rec topical abx until full resolution.       Family circumstance   Stressful period at home. Support provided. To consider individual vs couples counseling        Meds ordered this encounter  Medications   allopurinol  (ZYLOPRIM ) 100 MG tablet    Sig: Take 1.5 tablets (150 mg total) by mouth daily.    Dispense:  135 tablet    Refill:  4   COLCRYS  0.6 MG tablet    Sig: TAKE 1 TABLET (0.6 MG TOTAL) BY MOUTH 2 (TWO) TIMES DAILY. AS NEEDED GOUT FLARE    Dispense:  30 tablet    Refill:  2   sildenafil  (REVATIO ) 20 MG tablet    Sig: Take 2 tablets (40 mg total) by mouth daily as needed.    Dispense:  30 tablet    Refill:  6     No orders of the defined types were placed in this encounter.   Patient Instructions  Call [facility] to follow up on dermatology appointment, let me know if I need to place new referral to [facility]. May stop clotrimazole  (lotrimin ), instead use neosporin antibotic cream over the counter to skin in groin.  Good to see you today Return as needed or in 1 year for next physical.  Follow up plan: Return in about 1 year (around 06/07/2025), or if symptoms worsen or fail to improve, for medicare wellness visit, annual exam, prior fasting for blood work.  Anton Blas, MD

## 2024-06-07 NOTE — Patient Instructions (Addendum)
 Call [facility] to follow up on dermatology appointment, let me know if I need to place new referral to [facility]. May stop clotrimazole  (lotrimin ), instead use neosporin antibotic cream over the counter to skin in groin.  Good to see you today Return as needed or in 1 year for next physical.

## 2024-06-07 NOTE — Telephone Encounter (Signed)
 Pharmacy Patient Advocate Encounter   Received notification from CoverMyMeds that prior authorization for Sildenafil  Citrate (PAH) 20MG  tablets is required/requested.   Insurance verification completed.   The patient is insured through [facility] .   Per test claim: PA required; PA submitted to above mentioned insurance via CoverMyMeds Key/confirmation #/EOC [id number] Status is pending

## 2024-06-07 NOTE — Assessment & Plan Note (Signed)
 Preventative protocols reviewed and updated unless pt declined. Discussed healthy diet and lifestyle.

## 2024-06-08 NOTE — Telephone Encounter (Signed)
 Noted

## 2024-06-08 NOTE — Telephone Encounter (Signed)
Spoke with pt relaying Dr. G's message.  Pt expresses his thanks.  ?

## 2024-06-23 ENCOUNTER — Other Ambulatory Visit (HOSPITAL_COMMUNITY): Payer: Self-pay

## 2024-07-27 ENCOUNTER — Other Ambulatory Visit (HOSPITAL_COMMUNITY): Payer: Self-pay

## 2024-10-09 ENCOUNTER — Ambulatory Visit: Payer: Medicare Other

## 2024-10-09 VITALS — BP 118/76 | Ht 68.0 in | Wt 185.0 lb

## 2024-10-09 DIAGNOSIS — Z0000 Encounter for general adult medical examination without abnormal findings: Secondary | ICD-10-CM

## 2024-10-09 NOTE — Patient Instructions (Signed)
 Frederick Stephens,  Thank you for taking the time for your Medicare Wellness Visit. I appreciate your continued commitment to your health goals. Please review the care plan we discussed, and feel free to reach out if I can assist you further.  Please note that Annual Wellness Visits do not include a physical exam. Some assessments may be limited, especially if the visit was conducted virtually. If needed, we may recommend an in-person follow-up with your provider.  Ongoing Care Seeing your primary care provider every 3 to 6 months helps us  monitor your health and provide consistent, personalized care.   Referrals If a referral was made during today's visit and you haven't received any updates within two weeks, please contact the referred provider directly to check on the status.  Recommended Screenings:  Health Maintenance  Topic Date Due   Medicare Annual Wellness Visit  10/04/2024   COVID-19 Vaccine (8 - Pfizer risk 2025-26 season) 03/20/2025   DTaP/Tdap/Td vaccine (2 - Td or Tdap) 11/23/2028   Pneumococcal Vaccine for age over 20  Completed   Flu Shot  Completed   Hepatitis C Screening  Completed   Zoster (Shingles) Vaccine  Completed   Meningitis B Vaccine  Aged Out   Hepatitis B Vaccine  Discontinued   Colon Cancer Screening  Discontinued       10/09/2024   11:09 AM  Advanced Directives  Does Patient Have a Medical Advance Directive? Yes  Type of Advance Directive Living will  Does patient want to make changes to medical advance directive? No - Guardian declined  Would patient like information on creating a medical advance directive? No - Patient declined    Vision: Annual vision screenings are recommended for early detection of glaucoma, cataracts, and diabetic retinopathy. These exams can also reveal signs of chronic conditions such as diabetes and high blood pressure.  Dental: Annual dental screenings help detect early signs of oral cancer, gum disease, and other conditions  linked to overall health, including heart disease and diabetes.  Please see the attached documents for additional preventive care recommendations.

## 2024-10-09 NOTE — Progress Notes (Signed)
 Because this visit was a virtual/telehealth visit,  certain criteria was not obtained, such a blood pressure, CBG if applicable, and timed get up and go. Any medications not marked as taking were not mentioned during the medication reconciliation part of the visit. Any vitals not documented were not able to be obtained due to this being a telehealth visit or patient was unable to self-report a recent blood pressure reading due to a lack of equipment at home via telehealth. Vitals that have been documented are verbally provided by the patient.  This visit was performed by a medical professional under my direct supervision. I was immediately available for consultation/collaboration. I have reviewed and agree with the Annual Wellness Visit documentation.  Subjective:   Frederick Stephens is a 78 y.o. male who presents for a Medicare Annual Wellness Visit.  Allergies (verified) Aldactone [spironolactone] and Amiodarone   History: Past Medical History:  Diagnosis Date   Allergy    Anxiety    CAP (community acquired pneumonia) 01/03/2014   CHF (congestive heart failure) (HCC)    ?HTN related   Dilated cardiomyopathy (HCC)    familial (NICM)   Elevated PSA 06/21/2016   40s - s/p benign biopsy 2017 followed closely by Dr Ottelin urology    GERD (gastroesophageal reflux disease)    Gout    History of chicken pox    History of shingles 2012   HTN (hypertension)    Neuromuscular disorder (HCC)    OSA (obstructive sleep apnea)    Osteoporosis 2015   per [facility] records by DEXA 2015, on fosamax   S/P orthotopic heart transplant (HCC) 11/29/2008   due to heart failure,cardiomyopathy   Past Surgical History:  Procedure Laterality Date   CARDIAC CATHETERIZATION  12/2020   normal coronary arteries - [facility]   CARDIAC VALVE REPLACEMENT  11-28-2008   Heart Transplant   COLONOSCOPY  10/2014   [facility])   dobutamine stress test  11/2014   normal   ESOPHAGOGASTRODUODENOSCOPY  07/2018   soft palate  polyp (rec f/u ENT), esophageal strictures x2 s/p dilatation, HH ([facility])   HEART TRANSPLANT  11/29/2008   [facility] (Dr Debby Fairy [facility]les [facility])   Family History  Problem Relation Age of Onset   Gout Mother    Hypertension Mother    Heart disease Mother    Gout Brother    Hypertension Brother    Heart disease Father    Diabetes Brother    Heart disease Brother    Cancer Neg Hx    Stroke Neg Hx    Social History   Occupational History   Not on file  Tobacco Use   Smoking status: Never   Smokeless tobacco: Never  Substance and Sexual Activity   Alcohol use: No   Drug use: No   Sexual activity: Not on file   Tobacco Counseling Counseling given: Not Answered  SDOH Screenings   Food Insecurity: No Food Insecurity (10/09/2024)  Housing: Low Risk  (10/09/2024)  Transportation Needs: No Transportation Needs (10/09/2024)  Utilities: Not At Risk (10/09/2024)  Alcohol Screen: Low Risk  (06/07/2024)  Depression (PHQ2-9): Low Risk  (10/09/2024)  Financial Resource Strain: Low Risk  (06/07/2024)  Physical Activity: Insufficiently Active (10/09/2024)  Social Connections: Socially Integrated (10/09/2024)  Stress: Stress Concern Present (10/09/2024)  Tobacco Use: Low Risk  (10/09/2024)  Health Literacy: Adequate Health Literacy (10/09/2024)   Depression Screen    10/09/2024   11:08 AM 06/07/2024   12:26 PM 03/15/2024    2:06 PM 10/05/2023  1:16 PM 02/24/2023   10:14 AM 10/01/2022   11:25 AM 10/19/2018   12:18 PM  PHQ 2/9 Scores  PHQ - 2 Score 0 1 1 0 1 0 0  PHQ- 9 Score 1 4 5  4        Goals Addressed             This Visit's Progress    Patient Stated       To move more       Visit info / Clinical Intake: Medicare Wellness Visit Type:: Subsequent Annual Wellness Visit Medicare Wellness Visit Mode:: Telephone If telephone:: video declined If telephone or video:: pt reported vitals Interpreter Needed?: No Pre-visit prep was completed: no AWV questionnaire completed by  patient prior to visit?: no Living arrangements:: lives with spouse/significant other Patient's Overall Health Status Rating: excellent Typical amount of pain: none Does pain affect daily life?: no Are you currently prescribed opioids?: no  Dietary Habits and Nutritional Risks How many meals a day?: 3 Eats fruit and vegetables daily?: yes Most meals are obtained by: preparing own meals In the last 2 weeks, have you had any of the following?: -- (n/a) Diabetic:: no  Functional Status Activities of Daily Living (to include ambulation/medication): Independent Ambulation: Independent Medication Administration: Independent Home Management: Independent Manage your own finances?: yes Primary transportation is: driving Concerns about vision?: no *vision screening is required for WTM* Concerns about hearing?: no  Fall Screening Falls in the past year?: 0 Number of falls in past year: 0 Was there an injury with Fall?: 0 Fall Risk Category Calculator: 0 Patient Fall Risk Level: Low Fall Risk  Fall Risk Patient at Risk for Falls Due to: No Fall Risks Fall risk Follow up: Falls evaluation completed  Home and Transportation Safety: All rugs have non-skid backing?: yes All stairs or steps have railings?: yes Grab bars in the bathtub or shower?: (!) no Have non-skid surface in bathtub or shower?: yes Good home lighting?: yes Regular seat belt use?: yes Hospital stays in the last year:: no  Cognitive Assessment Difficulty concentrating, remembering, or making decisions? : yes Will 6CIT or Mini Cog be Completed: no 6CIT or Mini Cog Declined: patient alert, oriented, able to answer questions appropriately and recall recent events  Advance Directives (For Healthcare) Does Patient Have a Medical Advance Directive?: Yes Does patient want to make changes to medical advance directive?: No - Guardian declined Type of Advance Directive: Living will Would patient like information on  creating a medical advance directive?: No - Patient declined  Reviewed/Updated  Reviewed/Updated: All        Objective:    Today's Vitals   10/09/24 1107  BP: 118/76  Weight: 185 lb (83.9 kg)  Height: 5' 8 (1.727 m)   Body mass index is 28.13 kg/m.  Current Medications (verified) Outpatient Encounter Medications as of 10/09/2024  Medication Sig   allopurinol  (ZYLOPRIM ) 100 MG tablet Take 1.5 tablets (150 mg total) by mouth daily.   amLODipine (NORVASC) 2.5 MG tablet Take 2.5 mg by mouth 2 (two) times daily.   amoxicillin  (AMOXIL ) 500 MG capsule TAKE 4 CAPSULES BY MOUTH DAILY AS NEEDED (FOR DENTAL PROCEDURES).   aspirin 81 MG tablet Take 81 mg by mouth daily.   Calcium Carb-Cholecalciferol (CALCIUM-VITAMIN D ) 600-400 MG-UNIT TABS Take 1 tablet by mouth daily.   colchicine  (COLCRYS ) 0.6 MG tablet TAKE 1 TABLET (0.6 MG TOTAL) BY MOUTH 2 (TWO) TIMES DAILY. AS NEEDED GOUT FLARE   hydrocortisone  (ANUSOL -HC) 2.5 % rectal  cream PLACE 1 APPLICATION RECTALLY 2 (TWO) TIMES DAILY.   Magnesium  Glycinate (MAGNESIUM  GLYCINATE ADVANCED) 120 MG CAPS Take 2 capsules by mouth in the morning and at bedtime.   mycophenolate (CELLCEPT) 250 MG capsule Take 2 capsules (500 mg total) by mouth 2 (two) times daily. To prevent organ rejection   nystatin  cream (MYCOSTATIN ) APPLY TO AFFECTED AREA TWICE A DAY   omeprazole (PRILOSEC) 20 MG capsule Take 1 tablet by mouth daily.   propranolol (INDERAL) 80 MG tablet Take 1 tablet (80 mg total) by mouth 2 (two) times daily.   rosuvastatin (CRESTOR) 20 MG tablet Take 20 mg by mouth daily.   sildenafil  (REVATIO ) 20 MG tablet Take 2 tablets (40 mg total) by mouth daily as needed.   tacrolimus (PROGRAF) 0.5 MG capsule Take 1 capsule (0.5 mg total) by mouth 2 (two) times daily. On MWF takes only one pill - To prevent organ rejection   No facility-administered encounter medications on file as of 10/09/2024.   Hearing/Vision screen Hearing Screening - Comments:: No  difficulties Vision Screening - Comments:: Patient wears glasses  Immunizations and Health Maintenance Health Maintenance  Topic Date Due   COVID-19 Vaccine (8 - Pfizer risk 2025-26 season) 03/20/2025   Medicare Annual Wellness (AWV)  10/09/2025   DTaP/Tdap/Td (2 - Td or Tdap) 11/23/2028   Pneumococcal Vaccine: 50+ Years  Completed   Influenza Vaccine  Completed   Hepatitis C Screening  Completed   Zoster Vaccines- Shingrix  Completed   Meningococcal B Vaccine  Aged Out   Hepatitis B Vaccines 19-59 Average Risk  Discontinued   Colonoscopy  Discontinued        Assessment/Plan:  This is a routine wellness examination for Elsie.  Patient Care Team: Rilla Baller, MD as PCP - General (Family Medicine)  I have personally reviewed and noted the following in the patient's chart:   Medical and social history Use of alcohol, tobacco or illicit drugs  Current medications and supplements including opioid prescriptions. Functional ability and status Nutritional status Physical activity Advanced directives List of other physicians Hospitalizations, surgeries, and ER visits in previous 12 months Vitals Screenings to include cognitive, depression, and falls Referrals and appointments  No orders of the defined types were placed in this encounter.  In addition, I have reviewed and discussed with patient certain preventive protocols, quality metrics, and best practice recommendations. A written personalized care plan for preventive services as well as general preventive health recommendations were provided to patient.   Lyle MARLA Right, CMA   10/09/2024   Return in 1 year (on 10/09/2025).  After Visit Summary: (MyChart) Due to this being a telephonic visit, the after visit summary with patients personalized plan was offered to patient via MyChart   Nurse Notes: nothing to report

## 2024-10-19 NOTE — Progress Notes (Signed)
 I connected with  Frederick Stephens on 10/09/2024 by a audio enabled telemedicine application and verified that I am speaking with the correct person using two identifiers.  Patient Location: Home  Provider Location: Home Office  Persons Participating in Visit: Patient.  I discussed the limitations of evaluation and management by telemedicine. The patient expressed understanding and agreed to proceed.  Vital Signs: Because this visit was a virtual/telehealth visit, some criteria may be missing or patient reported. Any vitals not documented were not able to be obtained and vitals that have been documented are patient reported.  Because this visit was a virtual/telehealth visit,  certain criteria was not obtained, such a blood pressure, CBG if applicable, and timed get up and go. Any medications not marked as taking were not mentioned during the medication reconciliation part of the visit. Any vitals not documented were not able to be obtained due to this being a telehealth visit or patient was unable to self-report a recent blood pressure reading due to a lack of equipment at home via telehealth. Vitals that have been documented are verbally provided by the patient.  This visit was performed by a medical professional under my direct supervision. I was immediately available for consultation/collaboration. I have reviewed and agree with the Annual Wellness Visit documentation.  Subjective:   Frederick Stephens is a 78 y.o. male who presents for a Medicare Annual Wellness Visit.  Allergies (verified) Aldactone [spironolactone] and Amiodarone   History: Past Medical History:  Diagnosis Date   Allergy    Anxiety    CAP (community acquired pneumonia) 01/03/2014   CHF (congestive heart failure) (HCC)    ?HTN related   Dilated cardiomyopathy (HCC)    familial (NICM)   Elevated PSA 06/21/2016   40s - s/p benign biopsy 2017 followed closely by Dr Ottelin urology    GERD (gastroesophageal reflux  disease)    Gout    History of chicken pox    History of shingles 2012   HTN (hypertension)    Neuromuscular disorder (HCC)    OSA (obstructive sleep apnea)    Osteoporosis 2015   per [facility] records by DEXA 2015, on fosamax   S/P orthotopic heart transplant (HCC) 11/29/2008   due to heart failure,cardiomyopathy   Past Surgical History:  Procedure Laterality Date   CARDIAC CATHETERIZATION  12/2020   normal coronary arteries - [facility]   CARDIAC VALVE REPLACEMENT  11-28-2008   Heart Transplant   COLONOSCOPY  10/2014   [facility])   dobutamine stress test  11/2014   normal   ESOPHAGOGASTRODUODENOSCOPY  07/2018   soft palate polyp (rec f/u ENT), esophageal strictures x2 s/p dilatation, HH ([facility])   HEART TRANSPLANT  11/29/2008   [facility] (Dr Debby Fairy [facility])   Family History  Problem Relation Age of Onset   Gout Mother    Hypertension Mother    Heart disease Mother    Gout Brother    Hypertension Brother    Heart disease Father    Diabetes Brother    Heart disease Brother    Cancer Neg Hx    Stroke Neg Hx    Social History   Occupational History   Not on file  Tobacco Use   Smoking status: Never   Smokeless tobacco: Never  Substance and Sexual Activity   Alcohol use: No   Drug use: No   Sexual activity: Not on file   Tobacco Counseling Counseling given: Not Answered  SDOH Screenings   Food Insecurity: No Food  Insecurity (10/09/2024)  Housing: Low Risk  (10/09/2024)  Transportation Needs: No Transportation Needs (10/09/2024)  Utilities: Not At Risk (10/09/2024)  Alcohol Screen: Low Risk  (06/07/2024)  Depression (PHQ2-9): Low Risk  (10/09/2024)  Financial Resource Strain: Low Risk  (06/07/2024)  Physical Activity: Insufficiently Active (10/09/2024)  Social Connections: Socially Integrated (10/09/2024)  Stress: Stress Concern Present (10/09/2024)  Tobacco Use: Low Risk  (10/09/2024)  Health Literacy: Adequate Health Literacy (10/09/2024)   Depression Screen     10/09/2024   11:08 AM 06/07/2024   12:26 PM 03/15/2024    2:06 PM 10/05/2023    1:16 PM 02/24/2023   10:14 AM 10/01/2022   11:25 AM 10/19/2018   12:18 PM  PHQ 2/9 Scores  PHQ - 2 Score 0 1 1 0 1 0 0  PHQ- 9 Score 1  4  5   4         Data saved with a previous flowsheet row definition     Goals Addressed             This Visit's Progress    Patient Stated       To move more       Visit info / Clinical Intake: Medicare Wellness Visit Type:: Subsequent Annual Wellness Visit Medicare Wellness Visit Mode:: Telephone If telephone:: video declined Because this visit was a virtual/telehealth visit:: pt reported vitals Interpreter Needed?: No Pre-visit prep was completed: no AWV questionnaire completed by patient prior to visit?: no Living arrangements:: lives with spouse/significant other Patient's Overall Health Status Rating: excellent Typical amount of pain: none Does pain affect daily life?: no Are you currently prescribed opioids?: no  Dietary Habits and Nutritional Risks How many meals a day?: 3 Eats fruit and vegetables daily?: yes Most meals are obtained by: preparing own meals In the last 2 weeks, have you had any of the following?: -- (n/a) Diabetic:: no  Functional Status Activities of Daily Living (to include ambulation/medication): Independent Ambulation: Independent Medication Administration: Independent Home Management: Independent Manage your own finances?: yes Primary transportation is: driving Concerns about vision?: no *vision screening is required for WTM* Concerns about hearing?: no  Fall Screening Falls in the past year?: 0 Number of falls in past year: 0 Was there an injury with Fall?: 0 Fall Risk Category Calculator: 0 Patient Fall Risk Level: Low Fall Risk  Fall Risk Patient at Risk for Falls Due to: No Fall Risks Fall risk Follow up: Falls evaluation completed  Home and Transportation Safety: All rugs have non-skid backing?: yes All  stairs or steps have railings?: yes Grab bars in the bathtub or shower?: (!) no Have non-skid surface in bathtub or shower?: yes Good home lighting?: yes Regular seat belt use?: yes Hospital stays in the last year:: no  Cognitive Assessment Difficulty concentrating, remembering, or making decisions? : yes Will 6CIT or Mini Cog be Completed: no 6CIT or Mini Cog Declined: patient alert, oriented, able to answer questions appropriately and recall recent events  Advance Directives (For Healthcare) Does Patient Have a Medical Advance Directive?: Yes Does patient want to make changes to medical advance directive?: No - Guardian declined Type of Advance Directive: Living will Would patient like information on creating a medical advance directive?: No - Patient declined  Reviewed/Updated  Reviewed/Updated: All        Objective:    Today's Vitals   10/09/24 1107  BP: 118/76  Weight: 185 lb (83.9 kg)  Height: 5' 8 (1.727 m)   Body mass index is 28.13  kg/m.  Current Medications (verified) Outpatient Encounter Medications as of 10/09/2024  Medication Sig   allopurinol  (ZYLOPRIM ) 100 MG tablet Take 1.5 tablets (150 mg total) by mouth daily.   amLODipine (NORVASC) 2.5 MG tablet Take 2.5 mg by mouth 2 (two) times daily.   amoxicillin  (AMOXIL ) 500 MG capsule TAKE 4 CAPSULES BY MOUTH DAILY AS NEEDED (FOR DENTAL PROCEDURES).   aspirin 81 MG tablet Take 81 mg by mouth daily.   Calcium Carb-Cholecalciferol (CALCIUM-VITAMIN D ) 600-400 MG-UNIT TABS Take 1 tablet by mouth daily.   colchicine  (COLCRYS ) 0.6 MG tablet TAKE 1 TABLET (0.6 MG TOTAL) BY MOUTH 2 (TWO) TIMES DAILY. AS NEEDED GOUT FLARE   hydrocortisone  (ANUSOL -HC) 2.5 % rectal cream PLACE 1 APPLICATION RECTALLY 2 (TWO) TIMES DAILY.   Magnesium  Glycinate (MAGNESIUM  GLYCINATE ADVANCED) 120 MG CAPS Take 2 capsules by mouth in the morning and at bedtime.   mycophenolate (CELLCEPT) 250 MG capsule Take 2 capsules (500 mg total) by mouth 2  (two) times daily. To prevent organ rejection   nystatin  cream (MYCOSTATIN ) APPLY TO AFFECTED AREA TWICE A DAY   omeprazole (PRILOSEC) 20 MG capsule Take 1 tablet by mouth daily.   propranolol (INDERAL) 80 MG tablet Take 1 tablet (80 mg total) by mouth 2 (two) times daily.   rosuvastatin (CRESTOR) 20 MG tablet Take 20 mg by mouth daily.   sildenafil  (REVATIO ) 20 MG tablet Take 2 tablets (40 mg total) by mouth daily as needed.   tacrolimus (PROGRAF) 0.5 MG capsule Take 1 capsule (0.5 mg total) by mouth 2 (two) times daily. On MWF takes only one pill - To prevent organ rejection   No facility-administered encounter medications on file as of 10/09/2024.   Hearing/Vision screen Hearing Screening - Comments:: No difficulties Vision Screening - Comments:: Patient wears glasses  Immunizations and Health Maintenance Health Maintenance  Topic Date Due   COVID-19 Vaccine (8 - Pfizer risk 2025-26 season) 03/20/2025   Medicare Annual Wellness (AWV)  10/09/2025   DTaP/Tdap/Td (2 - Td or Tdap) 11/23/2028   Pneumococcal Vaccine: 50+ Years  Completed   Influenza Vaccine  Completed   Hepatitis C Screening  Completed   Zoster Vaccines- Shingrix  Completed   Meningococcal B Vaccine  Aged Out   Hepatitis B Vaccines 19-59 Average Risk  Discontinued   Colonoscopy  Discontinued        Assessment/Plan:  This is a routine wellness examination for Frederick.  Patient Care Team: Rilla Baller, MD as PCP - General (Family Medicine)  I have personally reviewed and noted the following in the patient's chart:   Medical and social history Use of alcohol, tobacco or illicit drugs  Current medications and supplements including opioid prescriptions. Functional ability and status Nutritional status Physical activity Advanced directives List of other physicians Hospitalizations, surgeries, and ER visits in previous 12 months Vitals Screenings to include cognitive, depression, and falls Referrals and  appointments  No orders of the defined types were placed in this encounter.  In addition, I have reviewed and discussed with patient certain preventive protocols, quality metrics, and best practice recommendations. A written personalized care plan for preventive services as well as general preventive health recommendations were provided to patient.   Frederick Stephens, CMA   10/19/2024   Return in 1 year (on 10/09/2025).  After Visit Summary: (MyChart) Due to this being a telephonic visit, the after visit summary with patients personalized plan was offered to patient via MyChart   Nurse Notes: nothing to report

## 2025-10-11 ENCOUNTER — Ambulatory Visit

## 2025-10-12 ENCOUNTER — Ambulatory Visit
